# Patient Record
Sex: Female | Born: 1955
Health system: Southern US, Community
[De-identification: ages and names within clinical notes are randomized; demographics above are authoritative.]

## PROBLEM LIST (undated history)

## (undated) DIAGNOSIS — Z87442 Personal history of urinary calculi: Secondary | ICD-10-CM

## (undated) DIAGNOSIS — G500 Trigeminal neuralgia: Secondary | ICD-10-CM

## (undated) DIAGNOSIS — I10 Essential (primary) hypertension: Secondary | ICD-10-CM

## (undated) DIAGNOSIS — I471 Supraventricular tachycardia, unspecified: Secondary | ICD-10-CM

## (undated) DIAGNOSIS — R519 Headache, unspecified: Secondary | ICD-10-CM

## (undated) DIAGNOSIS — E785 Hyperlipidemia, unspecified: Secondary | ICD-10-CM

## (undated) DIAGNOSIS — J320 Chronic maxillary sinusitis: Secondary | ICD-10-CM

## (undated) HISTORY — DX: Supraventricular tachycardia, unspecified: I47.10

## (undated) HISTORY — DX: Hyperlipidemia, unspecified: E78.5

## (undated) HISTORY — DX: Trigeminal neuralgia: G50.0

## (undated) HISTORY — PX: PARS PLANA REPAIR OF RETINAL DEATACHMENT: SHX2165

## (undated) HISTORY — DX: Supraventricular tachycardia: I47.1

## (undated) HISTORY — DX: Headache, unspecified: R51.9

## (undated) HISTORY — DX: Essential (primary) hypertension: I10

## (undated) HISTORY — DX: Personal history of urinary calculi: Z87.442

## (undated) HISTORY — PX: OTHER SURGICAL HISTORY: SHX169

## (undated) HISTORY — PX: RECTOCELE REPAIR: SHX761

## (undated) HISTORY — DX: Chronic maxillary sinusitis: J32.0

---

## 1998-03-14 ENCOUNTER — Ambulatory Visit (HOSPITAL_COMMUNITY): Admission: RE | Admit: 1998-03-14 | Discharge: 1998-03-14 | Payer: Self-pay | Admitting: Urology

## 1998-03-14 ENCOUNTER — Encounter: Payer: Self-pay | Admitting: Urology

## 1999-01-01 ENCOUNTER — Encounter: Payer: Self-pay | Admitting: Obstetrics and Gynecology

## 1999-01-01 ENCOUNTER — Encounter: Admission: RE | Admit: 1999-01-01 | Discharge: 1999-01-01 | Payer: Self-pay | Admitting: Obstetrics and Gynecology

## 2000-01-02 ENCOUNTER — Encounter: Payer: Self-pay | Admitting: Obstetrics and Gynecology

## 2000-01-02 ENCOUNTER — Encounter: Admission: RE | Admit: 2000-01-02 | Discharge: 2000-01-02 | Payer: Self-pay | Admitting: Obstetrics and Gynecology

## 2001-01-07 ENCOUNTER — Encounter: Admission: RE | Admit: 2001-01-07 | Discharge: 2001-01-07 | Payer: Self-pay | Admitting: Obstetrics and Gynecology

## 2001-01-07 ENCOUNTER — Encounter: Payer: Self-pay | Admitting: Obstetrics and Gynecology

## 2001-01-18 ENCOUNTER — Encounter: Admission: RE | Admit: 2001-01-18 | Discharge: 2001-01-18 | Payer: Self-pay | Admitting: Obstetrics and Gynecology

## 2001-01-18 ENCOUNTER — Encounter: Payer: Self-pay | Admitting: Obstetrics and Gynecology

## 2002-02-06 ENCOUNTER — Encounter: Payer: Self-pay | Admitting: Obstetrics and Gynecology

## 2002-02-06 ENCOUNTER — Encounter: Admission: RE | Admit: 2002-02-06 | Discharge: 2002-02-06 | Payer: Self-pay | Admitting: Obstetrics and Gynecology

## 2002-02-09 ENCOUNTER — Encounter (INDEPENDENT_AMBULATORY_CARE_PROVIDER_SITE_OTHER): Payer: Self-pay | Admitting: *Deleted

## 2002-02-09 ENCOUNTER — Encounter: Admission: RE | Admit: 2002-02-09 | Discharge: 2002-02-09 | Payer: Self-pay | Admitting: Obstetrics and Gynecology

## 2002-02-09 ENCOUNTER — Encounter: Payer: Self-pay | Admitting: Obstetrics and Gynecology

## 2002-05-15 ENCOUNTER — Other Ambulatory Visit: Admission: RE | Admit: 2002-05-15 | Discharge: 2002-05-15 | Payer: Self-pay | Admitting: Obstetrics and Gynecology

## 2002-05-17 ENCOUNTER — Encounter: Payer: Self-pay | Admitting: Obstetrics and Gynecology

## 2002-05-17 ENCOUNTER — Ambulatory Visit (HOSPITAL_COMMUNITY): Admission: RE | Admit: 2002-05-17 | Discharge: 2002-05-17 | Payer: Self-pay | Admitting: Obstetrics and Gynecology

## 2002-06-07 ENCOUNTER — Inpatient Hospital Stay (HOSPITAL_COMMUNITY): Admission: RE | Admit: 2002-06-07 | Discharge: 2002-06-09 | Payer: Self-pay | Admitting: Obstetrics and Gynecology

## 2002-06-07 ENCOUNTER — Encounter (INDEPENDENT_AMBULATORY_CARE_PROVIDER_SITE_OTHER): Payer: Self-pay | Admitting: Specialist

## 2003-04-17 ENCOUNTER — Encounter: Admission: RE | Admit: 2003-04-17 | Discharge: 2003-04-17 | Payer: Self-pay | Admitting: Obstetrics and Gynecology

## 2004-05-01 ENCOUNTER — Encounter: Admission: RE | Admit: 2004-05-01 | Discharge: 2004-05-01 | Payer: Self-pay | Admitting: Obstetrics and Gynecology

## 2005-05-04 ENCOUNTER — Encounter: Admission: RE | Admit: 2005-05-04 | Discharge: 2005-05-04 | Payer: Self-pay | Admitting: Obstetrics and Gynecology

## 2006-05-12 ENCOUNTER — Encounter: Admission: RE | Admit: 2006-05-12 | Discharge: 2006-05-12 | Payer: Self-pay | Admitting: Obstetrics and Gynecology

## 2007-05-17 ENCOUNTER — Encounter: Admission: RE | Admit: 2007-05-17 | Discharge: 2007-05-17 | Payer: Self-pay | Admitting: Obstetrics and Gynecology

## 2008-05-21 ENCOUNTER — Encounter: Admission: RE | Admit: 2008-05-21 | Discharge: 2008-05-21 | Payer: Self-pay | Admitting: Obstetrics and Gynecology

## 2009-05-28 ENCOUNTER — Encounter: Admission: RE | Admit: 2009-05-28 | Discharge: 2009-05-28 | Payer: Self-pay | Admitting: Obstetrics and Gynecology

## 2010-05-19 ENCOUNTER — Other Ambulatory Visit: Payer: Self-pay | Admitting: Obstetrics and Gynecology

## 2010-05-19 DIAGNOSIS — Z1231 Encounter for screening mammogram for malignant neoplasm of breast: Secondary | ICD-10-CM

## 2010-05-23 NOTE — H&P (Signed)
NAME:  Heather Jenkins, Heather Jenkins                           ACCOUNT NO.:  [id number]   MEDICAL RECORD NO.:  [id number]                   PATIENT TYPE:  INP   LOCATION:  NA                                   FACILITY:  [facility]   PHYSICIAN:  Huel Cote, M.D.              DATE OF BIRTH:  25-Sep-1955   DATE OF ADMISSION:  06/07/2002  DATE OF DISCHARGE:                                HISTORY & PHYSICAL   Time of surgery:  June 07, 2002, at 12:30 p.m. at [facility] facility.   HISTORY OF PRESENT ILLNESS:  The patient is a 55 year old G2, P2, who is  admitted for an abdominal hysterectomy and resection of left ovarian mass  with a concurrent anterior posterior repair.  The patient has had an ongoing  problem with rectocele and uterine prolapse symptoms; however, she was  electing conservative management until recently when found to have a 6 x 6  cm left ovarian lesion with some nodularity and a thickened wall suspicious  for endometrioma.  When the cyst was discovered on exam, the patient had no  symptoms; however, given its complex nature on ultrasound and its enlarged  size, the patient was advised to have surgical removal to rule out neoplasm  and prevent future torsion events.  With the surgery planned, decision was  made to proceed with her hysterectomy and repair of pelvic prolapse at the  same time.   PAST MEDICAL HISTORY:  Significant only for kidney stones treated by Loraine Leriche C.  Vernie Ammons, M.D.   PAST SURGICAL HISTORY:  None.   PAST OBSTETRICAL HISTORY:  Normal spontaneous vaginal delivery x 2.   PAST GYNECOLOGIC HISTORY:  No abnormal Pap smears.  Menstrual cycles are  regular.  She does have a rectocele which has been present for many years  and has become increasingly uncomfortable.   FAMILY HISTORY:  No breast cancer, colon cancer, or heart disease.   MEDICATIONS:  None.   ALLERGIES:  PENICILLIN.   SOCIAL HISTORY:  She is a nonsmoker.   REVIEW OF SYSTEMS:  The patient currently  denies any symptoms of urinary  frequency or stress urinary incontinence. She does have mild urgency.  Cholesterol performed in January 2003 was borderline.  A mammogram performed  in February 2004 showed a fibroadenoma which was subsequently biopsied and  confirmed benign.   ALLERGIES:  PENICILLIN.   MEDICATIONS:  She is taking none currently.   PHYSICAL EXAMINATION:  VITAL SIGNS:  Blood pressure 130/90, weight 159  pounds.  BREASTS:  No masses, discharge or adenopathy noted.  There is a left  fibroadenoma noted at 1 o'clock, approximately 1.5. cm, mobile.  CARDIOVASCULAR:  Regular rate and rhythm.  LUNGS:  Clear.  ABDOMEN:  Soft and nontender.  PELVIC:  Normal external genitalia noted with a large grade 3 rectocele  present down to the introitus.  The uterus does have moderate prolapse to  approximately  2 cm inside the introitus, and the adnexa on the left is  enlarged with the mass noted on ultrasound.  RECTAL:  Negative for heme and confirms pelvic exam findings.   ASSESSMENT:  The patient was counseled as to the risks and benefits of  surgery including bleeding, infection, possible damage to bowel and bladder.  Options of therapy were discussed with the patient including laparoscopic-  assisted vaginal hysterectomy; however, given the large nature of the lesion  on her left ovary, 6 x 6 cm, and the possible complexity in nature, decision  was made to proceed with abdominal approach to prevent any spill with the  removal of the mass and to enable good pelvic support after the uterus is  removed.  Frozen section will be sent during the surgery to confirm there is  no additional malignant potential to the left ovarian mass.  If benign, the  patient wishes to retain her normal ovaries for hormonal function.  The  patient understands the uterus, cervix, and left ovary in entirety will be  removed, and the right ovary only in the event of significant pathology.  We  also discussed  anterior posterior repair in detail to correct her rectocele,  and the patient understands all procedures and desires to proceed.                                               Huel Cote, M.D.    KR/MEDQ  D:  05/31/2002  T:  05/31/2002  Job:  [id number]

## 2010-05-23 NOTE — Discharge Summary (Signed)
   NAME:  Heather Jenkins, Heather Jenkins                           ACCOUNT NO.:  [id number]   MEDICAL RECORD NO.:  [id number]                   PATIENT TYPE:  INP   LOCATION:  0448                                 FACILITY:  [facility]   PHYSICIAN:  Huel Cote, M.D.              DATE OF BIRTH:  1955/02/12   DATE OF ADMISSION:  06/07/2002  DATE OF DISCHARGE:  06/09/2002                                 DISCHARGE SUMMARY   DISCHARGE DIAGNOSES:  1. Left ovarian mass.  2. Uterine prolapse.  3. Rectocele.  4. Status post total abdominal hysterectomy, resection of left ovarian mass,     and a posterior repair.   DISCHARGE MEDICATIONS:  1. Motrin 600 mg p.o. q.6 h.  2. Percocet 1-2 tablets p.o. every 4 hours p.r.n.   DISCHARGE FOLLOW UP:  The patient is to follow up in the office on Monday,  June 10, 2002 for staple removal.   HOSPITAL COURSE:  The patient is a 55 year old G 2, P 2 who was admitted for  an abdominal hysterectomy and a resection of a left ovarian mass with a  concurrent anterior and posterior repair planned.  The patient underwent the  procedure and was found to have a 6 cm endometrioma which was adherent to  the pelvic sidewalls, the colon, and the pelvic floor.  This was resected at  the time of surgery and the patient underwent a total abdominal hysterectomy  and resection of the left ovary and tube and the mass itself as well as a  posterior repair as there did not appear to be any anterior vaginal wall  prolapse.  She was then admitted for routine postoperative care and did very  well.  On postop day #2, was tolerating a regular diet with no nausea or  vomiting, remained afebrile throughout the day, and her abdomen was benign.  Her incision was approximated, therefore she was discharged to home with  followup planned in the office the following week for staple removal.                                               Huel Cote, M.D.    KR/MEDQ  D:  07/06/2002  T:  07/06/2002   Job:  [id number]

## 2010-05-23 NOTE — Op Note (Signed)
NAME:  Heather Jenkins, Heather Jenkins                           ACCOUNT NO.:  [id number]   MEDICAL RECORD NO.:  [id number]                   PATIENT TYPE:  INP   LOCATION:  X010                                 FACILITY:  [facility]   PHYSICIAN:  Huel Cote, M.D.              DATE OF BIRTH:  [birth year]   DATE OF PROCEDURE:  06/07/2002  DATE OF DISCHARGE:                                 OPERATIVE REPORT   PREOPERATIVE DIAGNOSIS:  1. Left ovarian mass.  2. Uterine prolapse.  3. Rectocele.   POSTOPERATIVE DIAGNOSIS:  1. Left ovarian mass.  2. Uterine prolapse.  3. Rectocele.  4. Large left endometrioma by frozen section at time of surgery.   PROCEDURE:  Total abdominal hysterectomy, resection of a left ovarian mass  and left salpingo-oophorectomy, posterior repair.   SURGEON:  Huel Cote, M.D.   ASSISTANT:  Malachi Pro. Ambrose Mantle, M.D.   ANESTHESIA:  General endotracheal.   ESTIMATED BLOOD LOSS:  500 cc.   IV FLUIDS:  3,500 cc lactated Ringer's.   URINE OUTPUT:  400 cc.   FINDINGS:  There was a large left endometrioma noted which was adherent to  the pelvic sidewall and colon as well as to the pelvic floor.  This was  ruptured upon removal with copious amounts of chocolate colored fluid  suctioned out of the abdomen.  The uterus itself was normal.  The right  ovary and fallopian tube were normal.  There was an additional focus of  endometriosis in the right cul-de-sac which was fulgurated at the time of  procedure and the ureters appeared normal bilaterally.   DESCRIPTION OF PROCEDURE:  The patient was taken to the operating room where  general anesthesia was obtained without difficulty.  She was then prepped  and draped in usual sterile fashion in the dorsal lithotomy position.  Attention was then turned to the patient's abdomen where a Pfannenstiel  incision was then made with the scalpel approximately 2.0 cm above the  symphysis pubis.  This was carried through to underlying layer  of fascia  with both sharp dissection and Bovie electrocautery.  The fascia was then  nicked in the midline and the incision was extended laterally with Mayo  scissors.  The inferior aspect was grasped with Kocher clamps, elevated and  dissected off the underlying rectus muscles.  The superior aspect was  likewise elevated and dissected off the rectus muscles.  The rectus muscles  were separated in the midline and the peritoneum entered bluntly.  The  peritoneal incision was then extended both superiorly and inferiorly with  careful attention to avoid both bowel or bladder.  Bladder washings were  then obtained and held to the side awaiting final Pathology.  The pediatric  Balfour retractor was then placed within the incision and the bowel packed  away with moist laparotomy sponges.  There was an area of colon which was  adherent to the  endometrioma on the left and the left pelvic sidewall.  A  plane was opened with the Bovie electrocautery and this was successfully  removed from that area.  The left round ligament was then held with the  Guernsey forceps and transected with Bovie electrocautery.  The remaining  retroperitoneal space was opened with Bovie electrocautery thus the  infundibulopelvic ligament on the left was clearly isolated.  After some  dissection the ureter was clearly identified in a much lower position along  the left pelvic sidewall with the ureter clearly visualized and out of the  way.  The infundibulopelvic ligament was clamped with slightly curved  Zeppelin clamps, transected and suture ligated times two with 0 Vicryl.  Additional bites of peritoneum were taken down both with Bovie  electrocautery and suture ligatures of 0 Vicryl.  This was taken down to the  level of the sidewall and the endometrium was then slightly more mobile  although still adherent posteriorly to bowel.  The uterus was grasped with  two slightly curved Kelly clamps at each cornua and the  utero-ovarian  ligament on the patient's left was doubly clamped, transected and suture  ligated with 0Vicryl.  At this point there was some chocolate thick fluid  noted to be leaking from the aspect of the endometrioma which was adherent  to the utero-ovarian ligament and it was partially collapsed but then  readily peeled away from the remaining of the pelvic floor and bowel by  blunt dissection.  With it completely isolated, one additional Heaney clamp  was placed across the remaining adherent broad ligament, transected and  suture ligated.  The endometrioma was then free and sent to Pathology for  confirmation though clinically appeared benign.  The abdomen and pelvis were  irrigated of all endometrioma-type fluid.  No active bleeding was noted.  Attention was then turned to the patient's right where the round ligament  was transected with Bovie electrocautery and the retroperitoneal space  opened.  The utero-ovarian ligament on the right was then isolated, doubly  clamped with slightly curved Zeppelin clamps, transected and suture ligated  with 0Vicryl times two.  The remainder of the cardinal and broad ligament  were then taken down bilaterally in sequential bites with slightly curved  Zeppelin clamp down to the level of the external os.  Two right angle  Zeppelin's were then placed at each cuff angle and the cervix was amputated  from the vaginal cuff with Mayo scissors.  Care was made to retain as much  as vaginal tissue as possible trimming the cervix just where it met with the  vagina itself.  The vaginal cuff was then closed at each angle with 0Vicryl  and three additional sutures of 0Vicryl in figure-of-eight sutures were  placed at the midline for good hemostasis.  The pelvis and abdomen were once  again irrigated copiously with no active bleeding noted.  The bowel was  inspected and found to be normal except for some adherent brown discoloration from the endometrioma which was  adherent to the colon.  The  utero-ovarian pedicle on the right and the infundibulopelvic ligament on the  left were both hemostatic and the cuff appeared hemostatic.  The uterosacral  ligaments were then reapproximated with 0 silk and the vagina secured to  these for increased pelvic support.  With no active bleeding noted,  instruments were removed from the abdomen as well as laparotomy sponges  which had a correct count at this point.  The omentum was  replaced in its  normal anatomical position.  The fascia was then closed with 0Vicryl in a  running locked fashion.  The subcutaneous tissue was reapproximated with 3-0  Vicryl and skin was closed with staples.  At this point again all instrument  and sponge counts were correct and the patient was repositioned with her  legs up in Cut and Shoot stirrups and attention was turned to the vagina.  With the  hysterectomy and uterosacral suspension performed, there was no obvious  cystocele and the anterior vaginal wall appeared to have good support,  therefore, no anterior repair was felt necessary.  The posterior wall  however continued to demonstrate a rather large rectocele.  Therefore a the  hymenal ring the two Allis clamps were placed and the small amount of  transverse perineal tissue was excised with the Metzenbaum scissors.  The  Metzenbaum scissors were then utilized to underscore the vaginal mucosa  overlying the rectum along the midline and this was carried up to  approximately within 2 to 3 cm of the vaginal cuff.  The Metzenbaum scissors  were then used to trim away the fascial plane from the overlying vaginal  mucosa and the rectocele was bluntly and sharp dissected to the midline.  At  this point several interrupted sutures of 0Vicryl were used to repair the  rectocele.  There was a transverse defect primarily which was closed with  several interrupted sutures of 0Vicryl in the fascial plane.  At the  conclusion of the closure rectal  examination was performed and there were no  stitches palpable in the rectum and there did appear to be good support in  the rectovaginal septum.  The remaining vaginal mucosa overlying the rectum  was then closed with 2Vicryl in a running locked fashion and good hemostasis  was noted although there was some slight oozing, therefore, the vagina was  packed with vaginal packing of iodoform gauze with Estrace cream.  The  vagina easily  admitted two side-by-side digits at the conclusion of  the procedure and  appeared to be approximately 4 to 5 inches in length.  Again, sponge, lap,  and needle counts were correct times two and the patient was taken to the  recovery room extubated and in stable condition.                                                 Huel Cote, M.D.    KR/MEDQ  D:  06/07/2002  T:  06/07/2002  Job:  [id number]

## 2010-05-30 ENCOUNTER — Ambulatory Visit
Admission: RE | Admit: 2010-05-30 | Discharge: 2010-05-30 | Disposition: A | Discharge status: Home / Self Care | Payer: BC Managed Care – PPO | Source: Ambulatory Visit | Attending: Obstetrics and Gynecology | Admitting: Obstetrics and Gynecology

## 2010-05-30 ENCOUNTER — Inpatient Hospital Stay: Admission: RE | Admit: 2010-05-30 | Payer: Self-pay | Source: Ambulatory Visit

## 2010-05-30 DIAGNOSIS — Z1231 Encounter for screening mammogram for malignant neoplasm of breast: Secondary | ICD-10-CM

## 2011-05-26 ENCOUNTER — Other Ambulatory Visit: Payer: Self-pay | Admitting: Obstetrics and Gynecology

## 2011-05-26 DIAGNOSIS — Z1231 Encounter for screening mammogram for malignant neoplasm of breast: Secondary | ICD-10-CM

## 2011-05-26 DIAGNOSIS — Z780 Asymptomatic menopausal state: Secondary | ICD-10-CM

## 2011-06-03 ENCOUNTER — Ambulatory Visit
Admission: RE | Admit: 2011-06-03 | Discharge: 2011-06-03 | Disposition: A | Discharge status: Home / Self Care | Payer: BC Managed Care – PPO | Source: Ambulatory Visit | Attending: Obstetrics and Gynecology | Admitting: Obstetrics and Gynecology

## 2011-06-03 DIAGNOSIS — Z1231 Encounter for screening mammogram for malignant neoplasm of breast: Secondary | ICD-10-CM

## 2011-06-15 ENCOUNTER — Ambulatory Visit
Admission: RE | Admit: 2011-06-15 | Discharge: 2011-06-15 | Disposition: A | Discharge status: Home / Self Care | Payer: BC Managed Care – PPO | Source: Ambulatory Visit | Attending: Obstetrics and Gynecology | Admitting: Obstetrics and Gynecology

## 2011-06-15 DIAGNOSIS — Z780 Asymptomatic menopausal state: Secondary | ICD-10-CM

## 2011-07-24 ENCOUNTER — Telehealth (INDEPENDENT_AMBULATORY_CARE_PROVIDER_SITE_OTHER): Payer: Self-pay | Admitting: General Surgery

## 2011-07-24 NOTE — Telephone Encounter (Signed)
Called patient cell/home #, left message for call to be returned regarding the appointment set for 07/27/11.  Patient needs to advise if she was self referred, Seen by another physician - if so, we need notes/tests, Did she have surgery - appointment set up as post op/recheck and there is nothing in epic stating this patient has been seen here. Appointment may have been made in error, thus the call to confirm with the patient.

## 2011-07-24 NOTE — Telephone Encounter (Signed)
Patient called returning Dana's phone call, reconfirming that her appointment had be cancelled.  She was scheduled by mistake, she hasn't any kind of hernia sx, has never been in our office and does not know who Dr. Cleone Slim is.  Transferred call to Luane School for further questioning on this issue.

## 2011-07-27 ENCOUNTER — Encounter (INDEPENDENT_AMBULATORY_CARE_PROVIDER_SITE_OTHER): Payer: Self-pay | Admitting: General Surgery

## 2012-05-23 ENCOUNTER — Other Ambulatory Visit: Payer: Self-pay

## 2012-05-23 DIAGNOSIS — Z1231 Encounter for screening mammogram for malignant neoplasm of breast: Secondary | ICD-10-CM

## 2012-06-23 ENCOUNTER — Ambulatory Visit
Admission: RE | Admit: 2012-06-23 | Discharge: 2012-06-23 | Disposition: A | Discharge status: Home / Self Care | Payer: BC Managed Care – PPO | Source: Ambulatory Visit

## 2012-06-23 DIAGNOSIS — Z1231 Encounter for screening mammogram for malignant neoplasm of breast: Secondary | ICD-10-CM

## 2013-06-12 ENCOUNTER — Other Ambulatory Visit: Payer: Self-pay

## 2013-06-12 DIAGNOSIS — Z1231 Encounter for screening mammogram for malignant neoplasm of breast: Secondary | ICD-10-CM

## 2013-06-27 ENCOUNTER — Encounter (INDEPENDENT_AMBULATORY_CARE_PROVIDER_SITE_OTHER): Payer: Self-pay

## 2013-06-27 ENCOUNTER — Ambulatory Visit
Admission: RE | Admit: 2013-06-27 | Discharge: 2013-06-27 | Disposition: A | Discharge status: Home / Self Care | Payer: BC Managed Care – PPO | Source: Ambulatory Visit

## 2013-06-27 DIAGNOSIS — Z1231 Encounter for screening mammogram for malignant neoplasm of breast: Secondary | ICD-10-CM

## 2014-06-08 ENCOUNTER — Other Ambulatory Visit: Payer: Self-pay

## 2014-06-08 DIAGNOSIS — Z1231 Encounter for screening mammogram for malignant neoplasm of breast: Secondary | ICD-10-CM

## 2014-06-29 ENCOUNTER — Ambulatory Visit
Admission: RE | Admit: 2014-06-29 | Discharge: 2014-06-29 | Disposition: A | Discharge status: Home / Self Care | Payer: BLUE CROSS/BLUE SHIELD | Source: Ambulatory Visit

## 2014-06-29 DIAGNOSIS — Z1231 Encounter for screening mammogram for malignant neoplasm of breast: Secondary | ICD-10-CM

## 2015-05-07 DIAGNOSIS — H40052 Ocular hypertension, left eye: Secondary | ICD-10-CM | POA: Diagnosis not present

## 2015-05-07 DIAGNOSIS — H40051 Ocular hypertension, right eye: Secondary | ICD-10-CM | POA: Diagnosis not present

## 2015-05-07 DIAGNOSIS — H43813 Vitreous degeneration, bilateral: Secondary | ICD-10-CM | POA: Diagnosis not present

## 2015-05-07 DIAGNOSIS — H524 Presbyopia: Secondary | ICD-10-CM | POA: Diagnosis not present

## 2015-05-09 DIAGNOSIS — Z01419 Encounter for gynecological examination (general) (routine) without abnormal findings: Secondary | ICD-10-CM | POA: Diagnosis not present

## 2015-05-09 DIAGNOSIS — Z130 Encounter for screening for diseases of the blood and blood-forming organs and certain disorders involving the immune mechanism: Secondary | ICD-10-CM | POA: Diagnosis not present

## 2015-05-09 DIAGNOSIS — Z1389 Encounter for screening for other disorder: Secondary | ICD-10-CM | POA: Diagnosis not present

## 2015-05-09 DIAGNOSIS — Z6833 Body mass index (BMI) 33.0-33.9, adult: Secondary | ICD-10-CM | POA: Diagnosis not present

## 2015-05-27 DIAGNOSIS — Z0000 Encounter for general adult medical examination without abnormal findings: Secondary | ICD-10-CM | POA: Diagnosis not present

## 2015-05-27 DIAGNOSIS — I119 Hypertensive heart disease without heart failure: Secondary | ICD-10-CM | POA: Diagnosis not present

## 2015-06-13 ENCOUNTER — Other Ambulatory Visit: Payer: Self-pay | Admitting: Obstetrics and Gynecology

## 2015-06-13 DIAGNOSIS — Z1231 Encounter for screening mammogram for malignant neoplasm of breast: Secondary | ICD-10-CM

## 2015-07-03 ENCOUNTER — Ambulatory Visit
Admission: RE | Admit: 2015-07-03 | Discharge: 2015-07-03 | Disposition: A | Discharge status: Home / Self Care | Payer: BLUE CROSS/BLUE SHIELD | Source: Ambulatory Visit | Attending: Obstetrics and Gynecology | Admitting: Obstetrics and Gynecology

## 2015-07-03 DIAGNOSIS — Z1231 Encounter for screening mammogram for malignant neoplasm of breast: Secondary | ICD-10-CM | POA: Diagnosis not present

## 2015-07-11 ENCOUNTER — Ambulatory Visit: Payer: BLUE CROSS/BLUE SHIELD

## 2015-10-14 DIAGNOSIS — J209 Acute bronchitis, unspecified: Secondary | ICD-10-CM | POA: Diagnosis not present

## 2015-10-14 DIAGNOSIS — J019 Acute sinusitis, unspecified: Secondary | ICD-10-CM | POA: Diagnosis not present

## 2015-10-23 DIAGNOSIS — J209 Acute bronchitis, unspecified: Secondary | ICD-10-CM | POA: Diagnosis not present

## 2015-10-23 DIAGNOSIS — I119 Hypertensive heart disease without heart failure: Secondary | ICD-10-CM | POA: Diagnosis not present

## 2015-11-25 DIAGNOSIS — I119 Hypertensive heart disease without heart failure: Secondary | ICD-10-CM | POA: Diagnosis not present

## 2015-11-25 DIAGNOSIS — D559 Anemia due to enzyme disorder, unspecified: Secondary | ICD-10-CM | POA: Diagnosis not present

## 2015-11-25 DIAGNOSIS — E039 Hypothyroidism, unspecified: Secondary | ICD-10-CM | POA: Diagnosis not present

## 2015-11-25 DIAGNOSIS — Z23 Encounter for immunization: Secondary | ICD-10-CM | POA: Diagnosis not present

## 2015-11-25 DIAGNOSIS — E7800 Pure hypercholesterolemia, unspecified: Secondary | ICD-10-CM | POA: Diagnosis not present

## 2016-02-25 DIAGNOSIS — I10 Essential (primary) hypertension: Secondary | ICD-10-CM | POA: Diagnosis not present

## 2016-05-20 DIAGNOSIS — H524 Presbyopia: Secondary | ICD-10-CM | POA: Diagnosis not present

## 2016-05-20 DIAGNOSIS — H40053 Ocular hypertension, bilateral: Secondary | ICD-10-CM | POA: Diagnosis not present

## 2016-05-20 DIAGNOSIS — H5213 Myopia, bilateral: Secondary | ICD-10-CM | POA: Diagnosis not present

## 2016-05-28 DIAGNOSIS — Z0000 Encounter for general adult medical examination without abnormal findings: Secondary | ICD-10-CM | POA: Diagnosis not present

## 2016-05-28 DIAGNOSIS — I10 Essential (primary) hypertension: Secondary | ICD-10-CM | POA: Diagnosis not present

## 2016-05-28 DIAGNOSIS — Z6841 Body Mass Index (BMI) 40.0 and over, adult: Secondary | ICD-10-CM | POA: Diagnosis not present

## 2016-05-28 DIAGNOSIS — Z1211 Encounter for screening for malignant neoplasm of colon: Secondary | ICD-10-CM | POA: Diagnosis not present

## 2016-06-05 DIAGNOSIS — Z01419 Encounter for gynecological examination (general) (routine) without abnormal findings: Secondary | ICD-10-CM | POA: Diagnosis not present

## 2016-06-05 DIAGNOSIS — Z1389 Encounter for screening for other disorder: Secondary | ICD-10-CM | POA: Diagnosis not present

## 2016-06-05 DIAGNOSIS — Z6832 Body mass index (BMI) 32.0-32.9, adult: Secondary | ICD-10-CM | POA: Diagnosis not present

## 2016-06-12 ENCOUNTER — Other Ambulatory Visit: Payer: Self-pay | Admitting: Obstetrics and Gynecology

## 2016-06-12 DIAGNOSIS — Z1231 Encounter for screening mammogram for malignant neoplasm of breast: Secondary | ICD-10-CM

## 2016-07-03 ENCOUNTER — Ambulatory Visit
Admission: RE | Admit: 2016-07-03 | Discharge: 2016-07-03 | Disposition: A | Discharge status: Home / Self Care | Payer: BLUE CROSS/BLUE SHIELD | Source: Ambulatory Visit | Attending: Obstetrics and Gynecology | Admitting: Obstetrics and Gynecology

## 2016-07-03 DIAGNOSIS — Z1231 Encounter for screening mammogram for malignant neoplasm of breast: Secondary | ICD-10-CM

## 2016-11-03 DIAGNOSIS — Z23 Encounter for immunization: Secondary | ICD-10-CM | POA: Diagnosis not present

## 2016-12-04 ENCOUNTER — Ambulatory Visit
Admission: RE | Admit: 2016-12-04 | Discharge: 2016-12-04 | Disposition: A | Discharge status: Home / Self Care | Payer: BLUE CROSS/BLUE SHIELD | Source: Ambulatory Visit | Attending: Internal Medicine | Admitting: Internal Medicine

## 2016-12-04 ENCOUNTER — Other Ambulatory Visit: Payer: Self-pay | Admitting: Internal Medicine

## 2016-12-04 DIAGNOSIS — I10 Essential (primary) hypertension: Secondary | ICD-10-CM | POA: Diagnosis not present

## 2016-12-04 DIAGNOSIS — T1490XA Injury, unspecified, initial encounter: Secondary | ICD-10-CM

## 2016-12-04 DIAGNOSIS — N200 Calculus of kidney: Secondary | ICD-10-CM | POA: Diagnosis not present

## 2016-12-04 DIAGNOSIS — R609 Edema, unspecified: Secondary | ICD-10-CM

## 2016-12-04 DIAGNOSIS — R319 Hematuria, unspecified: Secondary | ICD-10-CM | POA: Diagnosis not present

## 2016-12-04 DIAGNOSIS — M7989 Other specified soft tissue disorders: Secondary | ICD-10-CM | POA: Diagnosis not present

## 2017-01-18 DIAGNOSIS — J029 Acute pharyngitis, unspecified: Secondary | ICD-10-CM | POA: Diagnosis not present

## 2017-06-01 DIAGNOSIS — H524 Presbyopia: Secondary | ICD-10-CM | POA: Diagnosis not present

## 2017-06-01 DIAGNOSIS — H40053 Ocular hypertension, bilateral: Secondary | ICD-10-CM | POA: Diagnosis not present

## 2017-06-01 DIAGNOSIS — H43813 Vitreous degeneration, bilateral: Secondary | ICD-10-CM | POA: Diagnosis not present

## 2017-06-01 DIAGNOSIS — H2513 Age-related nuclear cataract, bilateral: Secondary | ICD-10-CM | POA: Diagnosis not present

## 2017-06-02 DIAGNOSIS — I10 Essential (primary) hypertension: Secondary | ICD-10-CM | POA: Diagnosis not present

## 2017-06-02 DIAGNOSIS — D559 Anemia due to enzyme disorder, unspecified: Secondary | ICD-10-CM | POA: Diagnosis not present

## 2017-06-02 DIAGNOSIS — N200 Calculus of kidney: Secondary | ICD-10-CM | POA: Diagnosis not present

## 2017-06-02 DIAGNOSIS — E785 Hyperlipidemia, unspecified: Secondary | ICD-10-CM | POA: Diagnosis not present

## 2017-06-08 DIAGNOSIS — Z01419 Encounter for gynecological examination (general) (routine) without abnormal findings: Secondary | ICD-10-CM | POA: Diagnosis not present

## 2017-06-08 DIAGNOSIS — Z1389 Encounter for screening for other disorder: Secondary | ICD-10-CM | POA: Diagnosis not present

## 2017-06-08 DIAGNOSIS — Z6832 Body mass index (BMI) 32.0-32.9, adult: Secondary | ICD-10-CM | POA: Diagnosis not present

## 2017-06-21 ENCOUNTER — Other Ambulatory Visit: Payer: Self-pay | Admitting: Obstetrics and Gynecology

## 2017-06-21 DIAGNOSIS — Z1231 Encounter for screening mammogram for malignant neoplasm of breast: Secondary | ICD-10-CM

## 2017-06-23 DIAGNOSIS — R319 Hematuria, unspecified: Secondary | ICD-10-CM | POA: Diagnosis not present

## 2017-07-06 DIAGNOSIS — N202 Calculus of kidney with calculus of ureter: Secondary | ICD-10-CM | POA: Diagnosis not present

## 2017-07-06 DIAGNOSIS — R310 Gross hematuria: Secondary | ICD-10-CM | POA: Diagnosis not present

## 2017-07-06 DIAGNOSIS — N200 Calculus of kidney: Secondary | ICD-10-CM | POA: Diagnosis not present

## 2017-07-09 ENCOUNTER — Ambulatory Visit
Admission: RE | Admit: 2017-07-09 | Discharge: 2017-07-09 | Disposition: A | Discharge status: Home / Self Care | Payer: BLUE CROSS/BLUE SHIELD | Source: Ambulatory Visit | Attending: Obstetrics and Gynecology | Admitting: Obstetrics and Gynecology

## 2017-07-09 DIAGNOSIS — Z1231 Encounter for screening mammogram for malignant neoplasm of breast: Secondary | ICD-10-CM | POA: Diagnosis not present

## 2017-07-13 DIAGNOSIS — R310 Gross hematuria: Secondary | ICD-10-CM | POA: Diagnosis not present

## 2017-07-13 DIAGNOSIS — N200 Calculus of kidney: Secondary | ICD-10-CM | POA: Diagnosis not present

## 2017-07-15 DIAGNOSIS — N202 Calculus of kidney with calculus of ureter: Secondary | ICD-10-CM | POA: Diagnosis not present

## 2017-08-24 DIAGNOSIS — N200 Calculus of kidney: Secondary | ICD-10-CM | POA: Diagnosis not present

## 2017-12-23 DIAGNOSIS — N209 Urinary calculus, unspecified: Secondary | ICD-10-CM | POA: Diagnosis not present

## 2017-12-23 DIAGNOSIS — I10 Essential (primary) hypertension: Secondary | ICD-10-CM | POA: Diagnosis not present

## 2018-02-14 DIAGNOSIS — J019 Acute sinusitis, unspecified: Secondary | ICD-10-CM | POA: Diagnosis not present

## 2018-02-24 ENCOUNTER — Other Ambulatory Visit: Payer: Self-pay | Admitting: Internal Medicine

## 2018-02-24 ENCOUNTER — Ambulatory Visit
Admission: RE | Admit: 2018-02-24 | Discharge: 2018-02-24 | Disposition: A | Discharge status: Home / Self Care | Payer: BLUE CROSS/BLUE SHIELD | Source: Ambulatory Visit | Attending: Internal Medicine | Admitting: Internal Medicine

## 2018-02-24 DIAGNOSIS — J329 Chronic sinusitis, unspecified: Secondary | ICD-10-CM

## 2018-02-24 DIAGNOSIS — J3489 Other specified disorders of nose and nasal sinuses: Secondary | ICD-10-CM | POA: Diagnosis not present

## 2018-02-28 DIAGNOSIS — J019 Acute sinusitis, unspecified: Secondary | ICD-10-CM | POA: Diagnosis not present

## 2018-02-28 DIAGNOSIS — I10 Essential (primary) hypertension: Secondary | ICD-10-CM | POA: Diagnosis not present

## 2018-03-10 DIAGNOSIS — I10 Essential (primary) hypertension: Secondary | ICD-10-CM | POA: Diagnosis not present

## 2018-03-16 DIAGNOSIS — J320 Chronic maxillary sinusitis: Secondary | ICD-10-CM | POA: Diagnosis not present

## 2018-03-16 DIAGNOSIS — J322 Chronic ethmoidal sinusitis: Secondary | ICD-10-CM | POA: Diagnosis not present

## 2018-03-16 DIAGNOSIS — J342 Deviated nasal septum: Secondary | ICD-10-CM | POA: Diagnosis not present

## 2018-03-25 DIAGNOSIS — J342 Deviated nasal septum: Secondary | ICD-10-CM | POA: Diagnosis not present

## 2018-03-25 DIAGNOSIS — J3081 Allergic rhinitis due to animal (cat) (dog) hair and dander: Secondary | ICD-10-CM | POA: Diagnosis not present

## 2018-03-25 DIAGNOSIS — J301 Allergic rhinitis due to pollen: Secondary | ICD-10-CM | POA: Diagnosis not present

## 2018-06-06 DIAGNOSIS — J019 Acute sinusitis, unspecified: Secondary | ICD-10-CM | POA: Diagnosis not present

## 2018-06-06 DIAGNOSIS — K635 Polyp of colon: Secondary | ICD-10-CM | POA: Diagnosis not present

## 2018-06-06 DIAGNOSIS — Z008 Encounter for other general examination: Secondary | ICD-10-CM | POA: Diagnosis not present

## 2018-06-06 DIAGNOSIS — I10 Essential (primary) hypertension: Secondary | ICD-10-CM | POA: Diagnosis not present

## 2018-06-14 DIAGNOSIS — H04123 Dry eye syndrome of bilateral lacrimal glands: Secondary | ICD-10-CM | POA: Diagnosis not present

## 2018-06-14 DIAGNOSIS — H2513 Age-related nuclear cataract, bilateral: Secondary | ICD-10-CM | POA: Diagnosis not present

## 2018-06-14 DIAGNOSIS — H25013 Cortical age-related cataract, bilateral: Secondary | ICD-10-CM | POA: Diagnosis not present

## 2018-06-14 DIAGNOSIS — H524 Presbyopia: Secondary | ICD-10-CM | POA: Diagnosis not present

## 2018-07-05 DIAGNOSIS — Z124 Encounter for screening for malignant neoplasm of cervix: Secondary | ICD-10-CM | POA: Diagnosis not present

## 2018-07-05 DIAGNOSIS — Z01419 Encounter for gynecological examination (general) (routine) without abnormal findings: Secondary | ICD-10-CM | POA: Diagnosis not present

## 2018-07-05 DIAGNOSIS — Z130 Encounter for screening for diseases of the blood and blood-forming organs and certain disorders involving the immune mechanism: Secondary | ICD-10-CM | POA: Diagnosis not present

## 2018-07-05 DIAGNOSIS — Z6832 Body mass index (BMI) 32.0-32.9, adult: Secondary | ICD-10-CM | POA: Diagnosis not present

## 2018-07-06 ENCOUNTER — Other Ambulatory Visit: Payer: Self-pay | Admitting: Obstetrics and Gynecology

## 2018-07-06 DIAGNOSIS — Z1231 Encounter for screening mammogram for malignant neoplasm of breast: Secondary | ICD-10-CM

## 2018-07-07 ENCOUNTER — Other Ambulatory Visit: Payer: Self-pay | Admitting: Obstetrics and Gynecology

## 2018-07-07 DIAGNOSIS — E2839 Other primary ovarian failure: Secondary | ICD-10-CM

## 2018-07-11 ENCOUNTER — Other Ambulatory Visit: Payer: Self-pay

## 2018-07-11 ENCOUNTER — Ambulatory Visit
Admission: RE | Admit: 2018-07-11 | Discharge: 2018-07-11 | Disposition: A | Discharge status: Home / Self Care | Payer: BC Managed Care – PPO | Source: Ambulatory Visit | Attending: Obstetrics and Gynecology | Admitting: Obstetrics and Gynecology

## 2018-07-11 DIAGNOSIS — Z1231 Encounter for screening mammogram for malignant neoplasm of breast: Secondary | ICD-10-CM

## 2018-08-22 DIAGNOSIS — K635 Polyp of colon: Secondary | ICD-10-CM | POA: Diagnosis not present

## 2018-08-22 DIAGNOSIS — K5730 Diverticulosis of large intestine without perforation or abscess without bleeding: Secondary | ICD-10-CM | POA: Diagnosis not present

## 2018-08-22 DIAGNOSIS — Z1211 Encounter for screening for malignant neoplasm of colon: Secondary | ICD-10-CM | POA: Diagnosis not present

## 2018-08-29 DIAGNOSIS — N200 Calculus of kidney: Secondary | ICD-10-CM | POA: Diagnosis not present

## 2018-08-31 ENCOUNTER — Other Ambulatory Visit: Payer: BC Managed Care – PPO

## 2018-09-15 ENCOUNTER — Ambulatory Visit
Admission: RE | Admit: 2018-09-15 | Discharge: 2018-09-15 | Disposition: A | Discharge status: Home / Self Care | Payer: BC Managed Care – PPO | Source: Ambulatory Visit | Attending: Obstetrics and Gynecology | Admitting: Obstetrics and Gynecology

## 2018-09-15 ENCOUNTER — Other Ambulatory Visit: Payer: Self-pay

## 2018-09-15 DIAGNOSIS — E2839 Other primary ovarian failure: Secondary | ICD-10-CM

## 2018-09-15 DIAGNOSIS — Z780 Asymptomatic menopausal state: Secondary | ICD-10-CM | POA: Diagnosis not present

## 2018-09-15 DIAGNOSIS — M85851 Other specified disorders of bone density and structure, right thigh: Secondary | ICD-10-CM | POA: Diagnosis not present

## 2018-10-06 DIAGNOSIS — J019 Acute sinusitis, unspecified: Secondary | ICD-10-CM | POA: Diagnosis not present

## 2018-10-06 DIAGNOSIS — I10 Essential (primary) hypertension: Secondary | ICD-10-CM | POA: Diagnosis not present

## 2018-11-28 DIAGNOSIS — H43811 Vitreous degeneration, right eye: Secondary | ICD-10-CM | POA: Diagnosis not present

## 2018-12-12 DIAGNOSIS — H43811 Vitreous degeneration, right eye: Secondary | ICD-10-CM | POA: Diagnosis not present

## 2018-12-13 DIAGNOSIS — E7800 Pure hypercholesterolemia, unspecified: Secondary | ICD-10-CM | POA: Diagnosis not present

## 2018-12-13 DIAGNOSIS — R799 Abnormal finding of blood chemistry, unspecified: Secondary | ICD-10-CM | POA: Diagnosis not present

## 2018-12-13 DIAGNOSIS — I10 Essential (primary) hypertension: Secondary | ICD-10-CM | POA: Diagnosis not present

## 2019-01-03 DIAGNOSIS — I10 Essential (primary) hypertension: Secondary | ICD-10-CM | POA: Diagnosis not present

## 2019-01-27 DIAGNOSIS — E7849 Other hyperlipidemia: Secondary | ICD-10-CM | POA: Diagnosis not present

## 2019-01-27 DIAGNOSIS — R739 Hyperglycemia, unspecified: Secondary | ICD-10-CM | POA: Diagnosis not present

## 2019-01-27 DIAGNOSIS — I10 Essential (primary) hypertension: Secondary | ICD-10-CM | POA: Diagnosis not present

## 2019-01-27 DIAGNOSIS — Z1331 Encounter for screening for depression: Secondary | ICD-10-CM | POA: Diagnosis not present

## 2019-04-12 DIAGNOSIS — G500 Trigeminal neuralgia: Secondary | ICD-10-CM | POA: Diagnosis not present

## 2019-04-12 DIAGNOSIS — J0100 Acute maxillary sinusitis, unspecified: Secondary | ICD-10-CM | POA: Diagnosis not present

## 2019-04-20 DIAGNOSIS — J320 Chronic maxillary sinusitis: Secondary | ICD-10-CM | POA: Diagnosis not present

## 2019-04-20 DIAGNOSIS — G501 Atypical facial pain: Secondary | ICD-10-CM | POA: Diagnosis not present

## 2019-04-21 ENCOUNTER — Other Ambulatory Visit: Payer: Self-pay | Admitting: Internal Medicine

## 2019-04-21 DIAGNOSIS — J320 Chronic maxillary sinusitis: Secondary | ICD-10-CM

## 2019-04-21 DIAGNOSIS — G501 Atypical facial pain: Secondary | ICD-10-CM

## 2019-04-25 DIAGNOSIS — J342 Deviated nasal septum: Secondary | ICD-10-CM | POA: Diagnosis not present

## 2019-04-25 DIAGNOSIS — J3489 Other specified disorders of nose and nasal sinuses: Secondary | ICD-10-CM | POA: Diagnosis not present

## 2019-04-25 DIAGNOSIS — J320 Chronic maxillary sinusitis: Secondary | ICD-10-CM | POA: Diagnosis not present

## 2019-04-27 ENCOUNTER — Other Ambulatory Visit: Payer: Self-pay

## 2019-04-27 ENCOUNTER — Emergency Department (HOSPITAL_COMMUNITY)
Admission: EM | Admit: 2019-04-27 | Discharge: 2019-04-27 | Disposition: A | Discharge status: Home / Self Care | Payer: BC Managed Care – PPO | Attending: Emergency Medicine | Admitting: Emergency Medicine

## 2019-04-27 ENCOUNTER — Encounter (HOSPITAL_COMMUNITY): Payer: Self-pay | Admitting: Emergency Medicine

## 2019-04-27 DIAGNOSIS — R519 Headache, unspecified: Secondary | ICD-10-CM

## 2019-04-27 MED ORDER — HYDROCODONE-ACETAMINOPHEN 5-325 MG PO TABS
1.0000 | ORAL_TABLET | Freq: Once | ORAL | Status: AC
  Administered 2019-04-27: 1 via ORAL
  Filled 2019-04-27: qty 1

## 2019-04-27 NOTE — ED Provider Notes (Signed)
[facility] Note   CSN: [id number] Arrival date & time: 04/27/19  1455     History Chief Complaint  Patient presents with  . Trigeminal Neuralgia  . Facial Pain    Heather Jenkins is a 64 y.o. female who presents to the ED with episodes of left-sided facial pain described as "lightning" has been occurring for approximately 1 year.  She states that her symptoms abated for a period of time, but came back in February of this year.  Since then, she has been experiencing increased frequency, and during episodes that last 4-5 minutes several times per hour.  The symptoms affect her entire left-sided maxillary region with extension to her forehead and scalp.  She was seen by her primary care provider at [facility] who referred her to ENT because she also endorses a chronic history of sinusitis.  I reviewed patient's CT maxillofacial with contrast obtained 04/25/2019 which demonstrated some maxillary mucosal thickening, worse on the left side in the right side, however no other significant findings.  While patient endorses sinus congestion and the left side ear "fullness", this sharp, fleeting left-sided facial pain she suspects to be neurologic.  She has an appointment with [facility] scheduled for 05/02/2019.  She has been taking gabapentin, which is only mildly alleviate her symptoms of discomfort.  HPI     History reviewed. No pertinent past medical history.  There are no problems to display for this patient.   History reviewed. No pertinent surgical history.   OB History   No obstetric history on file.     No family history on file.  Social History   Tobacco Use  . Smoking status: Not on file  Substance Use Topics  . Alcohol use: Not on file  . Drug use: Not on file    Home Medications Prior to Admission medications   Not on File    Allergies    Penicillins  Review of Systems   Review of Systems  All  other systems reviewed and are negative.   Physical Exam Updated Vital Signs BP (!) 169/88 (BP Location: Left Arm)   Pulse 98   Temp 98.2 F (36.8 C) (Oral)   Resp 18   SpO2 97%   Physical Exam Vitals and nursing note reviewed. Exam conducted with a chaperone present.  Constitutional:      Appearance: Normal appearance.  HENT:     Head: Normocephalic and atraumatic.  Eyes:     General: No scleral icterus.    Extraocular Movements: Extraocular movements intact.     Conjunctiva/sclera: Conjunctivae normal.     Pupils: Pupils are equal, round, and reactive to light.  Cardiovascular:     Rate and Rhythm: Normal rate and regular rhythm.     Pulses: Normal pulses.     Heart sounds: Normal heart sounds.  Pulmonary:     Effort: Pulmonary effort is normal.  Musculoskeletal:     Cervical back: Normal range of motion and neck supple. No rigidity.  Skin:    General: Skin is dry.     Capillary Refill: Capillary refill takes less than 2 seconds.  Neurological:     Mental Status: She is alert.     GCS: GCS eye subscore is 4. GCS verbal subscore is 5. GCS motor subscore is 6.     Comments: CN II through XII grossly intact.  Smile symmetrically.  ROM and sensation intact throughout.  Ambulates without ataxia.  PERRL and EOM intact.  Left-sided facial sensation intact.  No pain elicited on my examination.  Psychiatric:        Mood and Affect: Mood normal.        Behavior: Behavior normal.        Thought Content: Thought content normal.      ED Results / Procedures / Treatments   Labs (all labs ordered are listed, but only abnormal results are displayed) Labs Reviewed - No data to display  EKG None  Radiology No results found.  Procedures Procedures (including critical care time)  Medications Ordered in ED Medications  HYDROcodone-acetaminophen (NORCO/VICODIN) 5-325 MG per tablet 1 tablet (has no administration in time range)    ED Course  I have reviewed the triage  vital signs and the nursing notes.  Pertinent labs & imaging results that were available during my care of the patient were reviewed by me and considered in my medical decision making (see chart for details).    MDM Rules/Calculators/A&P                      Patient is already taking gabapentin and recently had her dose increased.  Shortly after she was roomed here in the ED, she received a phone call from her primary care provider who states that they will be willing to prescribe her a short course of tramadol to help for breakthrough symptoms.  I feel as though that is appropriate.  I agree with her assessment and plan.  I personally reviewed patient's CT maxillofacial that was obtained 04/25/2019 do not feel as though any additional imaging is warranted at this time.  Do not feel as though laboratory work-up would yield any significant findings.  Her history of physical exam is suggestive of a trigeminal neuralgia and I feel as though neurology intervention is once ultimately going to lead to definitive management.  Encouraging patient to go to her appointment, as scheduled.  Do not want to alter current management at this time.  Strict ED return precautions discussed.  All of the evaluation and work-up results were discussed with the patient and any family at bedside. They were provided opportunity to ask any additional questions and have none at this time. They have expressed understanding of verbal discharge instructions as well as return precautions and are agreeable to the plan.   Final Clinical Impression(s) / ED Diagnoses Final diagnoses:  Facial pain    Rx / DC Orders ED Discharge Orders    None       Corena Herter, PA-C 04/27/19 Berton Mount, MD 04/28/19 1511

## 2019-04-27 NOTE — Discharge Instructions (Addendum)
Please read the attachment on trigeminal neuralgia.  Follow-up with your primary care provider regarding today's encounter.  You need take your medications, as prescribed.    Please go to your appointment with [facility] on 05/02/2019, as scheduled.  Please return to the ED or seek immediate medical attention should you experience any new or worsening symptoms.

## 2019-04-27 NOTE — ED Triage Notes (Signed)
Pt had issues with facial pain for year. Has trigeminal neuralgia and waiting to get into neurologist next week. Reports that pain is so bad couldn't wait to get into see them next week.  Started on Gabapentin but hasnt helped. Tried ice and heat, topical meds and no relief as well as tylenol and Ibuprofen.

## 2019-05-02 ENCOUNTER — Other Ambulatory Visit: Payer: BC Managed Care – PPO

## 2019-05-02 ENCOUNTER — Ambulatory Visit: Payer: BC Managed Care – PPO | Admitting: Neurology

## 2019-05-02 ENCOUNTER — Other Ambulatory Visit: Payer: Self-pay

## 2019-05-02 ENCOUNTER — Encounter: Payer: Self-pay | Admitting: Neurology

## 2019-05-02 VITALS — BP 144/74 | HR 93 | Temp 96.9°F | Ht 64.0 in | Wt 196.0 lb

## 2019-05-02 DIAGNOSIS — G500 Trigeminal neuralgia: Secondary | ICD-10-CM | POA: Insufficient documentation

## 2019-05-02 DIAGNOSIS — R7989 Other specified abnormal findings of blood chemistry: Secondary | ICD-10-CM | POA: Diagnosis not present

## 2019-05-02 DIAGNOSIS — R799 Abnormal finding of blood chemistry, unspecified: Secondary | ICD-10-CM | POA: Diagnosis not present

## 2019-05-02 MED ORDER — OXCARBAZEPINE 300 MG PO TABS: 300.0000 mg | ORAL_TABLET | Freq: Two times a day (BID) | ORAL | 11 refills | Status: DC

## 2019-05-02 MED ORDER — GABAPENTIN 300 MG PO CAPS: 600.0000 mg | ORAL_CAPSULE | Freq: Three times a day (TID) | ORAL | 11 refills | Status: DC

## 2019-05-02 NOTE — Progress Notes (Signed)
PATIENT: Heather Jenkins DOB: 08-05-1955  Chief Complaint  Patient presents with  . Possible Trigeminal Neuralgia    She is here, with her husband Heather Jenkins, for have her left-sided facial pain evaluated. Radiates up her nose and towards her eye. Symptoms started in September 2019. She has tried steroids in the past. She is currently taking gabapentin 300mg , two capsules TID and Tramadol 50mg , two tablets TID PRN.  . PCP    Dr.      HISTORICAL  Heather Jenkins is a 64 year old female, seen in request by her primary care physician Dr. Lajuana Jenkins, 69, for evaluation of intermittent left facial pain, she is accompanied by her husband at today's clinical visit on May 02, 2019.  I have reviewed and summarized the referring note from the referring physician.  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  She reported intermittent left facial pain, shooting from left cheek to left nose since September 2019, initial episode was short lasting, intermittent, she was treated as sinus infection  She had recurrent episode again in June 2020, lasting for 3 months, no diagnosis was made  She had most severe episode since February 2021, still ongoing, she reported the pain is present three-quarter of the time, sharp severe radiating pain from left cheek to left inner eye corner, worsening by teeth brushing, chewing, talking, touching her face,  CT sinus from [facility] on April 25, 2019, amount of mucosal thickening at the left greater than right inferior maxillary sinus, minimum LOCATION of the right ethmoid air cells,  Was treated with titrating dose of gabapentin which has helped her symptoms some, now taking 300 mg 2 tablets 3 times a day, complains of drowsiness, despite that, she has been taking tramadol 50 mg 2 tablets 3 times a day, the combination helped her pain better, but she complains of increased  side effect  She denies rash broke out, denies visual loss, hearing loss,  REVIEW OF SYSTEMS: Full 14 system review of systems performed and notable only for as above All other review of systems were negative.  ALLERGIES: Allergies  Allergen Reactions  . Penicillins Hives    HOME MEDICATIONS: Current Outpatient Medications  Medication Sig Dispense Refill  . atorvastatin (LIPITOR) 10 MG tablet Take 10 mg by mouth daily.    . Cholecalciferol (VITAMIN D3 PO) Take 1,000 Units by mouth daily.    . fexofenadine-pseudoephedrine (ALLEGRA-D 24) 180-240 MG 24 hr tablet Take 1 tablet by mouth daily as needed.    . gabapentin (NEURONTIN) 300 MG capsule Take 600 mg by mouth 3 (three) times daily.    . hydrochlorothiazide (HYDRODIURIL) 25 MG tablet Take 25 mg by mouth daily.    [place] losartan (COZAAR) 100 MG tablet Take 100 mg by mouth daily.    . traMADol (ULTRAM) 50 MG tablet Take 100 mg by mouth every 8 (eight) hours as needed.     No current facility-administered medications for this visit.    PAST MEDICAL HISTORY: Past Medical History:  Diagnosis Date  . Chronic maxillary sinusitis   . History of kidney stones   . Hyperlipemia   . Hypertension   . Left facial pain     PAST SURGICAL HISTORY: Past Surgical History:  Procedure Laterality Date  . RECTOCELE REPAIR    . TAH w/ single oophorectomy      FAMILY HISTORY: Family History  Problem Relation Age of Onset  .  Ovarian cancer Mother   . Suicidality Father        age 70  . Melanoma Sister     SOCIAL HISTORY: Social History   Socioeconomic History  . Marital status: Married    Spouse name: Not on file  . Number of children: 2  . Years of education: college  . Highest education level: Associate degree: academic program  Occupational History  . Occupation: Astronomer  Tobacco Use  . Smoking status: Never Smoker  . Smokeless tobacco: Never Used  Substance and Sexual Activity  . Alcohol use: Yes    Comment: rare    . Drug use: Never  . Sexual activity: Not on file  Other Topics Concern  . Not on file  Social History Narrative   Lives at home with husband.   Right-handed.   Caffeine: occasional use.   Social Determinants of Health   Financial Resource Strain:   . Difficulty of Paying Living Expenses:   Food Insecurity:   . Worried About Programme researcher, broadcasting/film/video in the Last Year:   . Barista in the Last Year:   Transportation Needs:   . Freight forwarder (Medical):   Marland Kitchen Lack of Transportation (Non-Medical):   Physical Activity:   . Days of Exercise per Week:   . Minutes of Exercise per Session:   Stress:   . Feeling of Stress :   Social Connections:   . Frequency of Communication with Friends and Family:   . Frequency of Social Gatherings with Friends and Family:   . Attends Religious Services:   . Active Member of Clubs or Organizations:   . Attends Banker Meetings:   Marland Kitchen Marital Status:   Intimate Partner Violence:   . Fear of Current or Ex-Partner:   . Emotionally Abused:   Marland Kitchen Physically Abused:   . Sexually Abused:      PHYSICAL EXAM   Vitals:   05/02/19 1440  BP: (!) 144/74  Pulse: 93  Temp: (!) 96.9 F (36.1 C)  Weight: 196 lb (88.9 kg)  Height: 5\' 4"  (1.626 m)    Not recorded      Body mass index is 33.64 kg/m.  PHYSICAL EXAMNIATION:  Gen: NAD, conversant, well nourised, well groomed                     Cardiovascular: Regular rate rhythm, no peripheral edema, warm, nontender. Eyes: Conjunctivae clear without exudates or hemorrhage Neck: Supple, no carotid bruits. Pulmonary: Clear to auscultation bilaterally   NEUROLOGICAL EXAM:  MENTAL STATUS: Speech:    Speech is normal; fluent and spontaneous with normal comprehension.  Cognition:     Orientation to time, place and person     Normal recent and remote memory     Normal Attention span and concentration     Normal Language, naming, repeating,spontaneous speech     Fund of  knowledge   CRANIAL NERVES: CN II: Visual fields are full to confrontation. Pupils are round equal and briskly reactive to light. CN III, IV, VI: extraocular movement are normal. No ptosis. CN V: Facial sensation is intact to light touch, bilateral corneal reflex was symmetric. CN VII: Face is symmetric with normal eye closure  CN VIII: Hearing is normal to causal conversation. CN IX, X: Phonation is normal. CN XI: Head turning and shoulder shrug are intact  MOTOR: There is no pronator drift of out-stretched arms. Muscle bulk and tone are normal. Muscle strength is  normal.  REFLEXES: Reflexes are 2+ and symmetric at the biceps, triceps, 3/3 knees, and ankles. Plantar responses are flexor.  SENSORY: Intact to light touch, pinprick and vibratory sensation are intact in fingers and toes.  COORDINATION: There is no trunk or limb dysmetria noted.  GAIT/STANCE: Posture is normal. Gait is steady with normal steps, base, arm swing, and turning. Heel and toe walking are normal. Tandem gait is normal.  Romberg is absent.   DIAGNOSTIC DATA (LABS, IMAGING, TESTING) - I reviewed patient records, labs, notes, testing and imaging myself where available.   ASSESSMENT AND PLAN  Semaya Vida Gunkel is a 64 y.o. female   Left trigeminal neuralgia  Laboratory evaluations  MRI of the brain with without contrast  She continue to have frequent pain taking gabapentin 600 mg 3 times a day, tramadol 50 mg 2 tablets 3 times a day  We will add on Trileptal titrating to 150 mg 2 tablets twice a day, if her facial pain improved, will taper down tramadol first, and gabapentin later  Return to clinic with nurse practitioner in 4 to 6 weeks  Marcial Pacas, M.D. Ph.D.  [facility] [address], [place], [place] [address] Ph: [phone] Fax: [phone]  CC: Michael Boston, MD

## 2019-05-03 ENCOUNTER — Telehealth: Payer: Self-pay | Admitting: Neurology

## 2019-05-03 LAB — CBC WITH DIFFERENTIAL
Basophils Absolute: 0 10*3/uL (ref 0.0–0.2)
Basos: 1 %
EOS (ABSOLUTE): 0.2 10*3/uL (ref 0.0–0.4)
Eos: 2 %
Hematocrit: 39.7 % (ref 34.0–46.6)
Hemoglobin: 13.3 g/dL (ref 11.1–15.9)
Immature Grans (Abs): 0 10*3/uL (ref 0.0–0.1)
Immature Granulocytes: 0 %
Lymphocytes Absolute: 2.1 10*3/uL (ref 0.7–3.1)
Lymphs: 31 %
MCH: 29.6 pg (ref 26.6–33.0)
MCHC: 33.5 g/dL (ref 31.5–35.7)
MCV: 88 fL (ref 79–97)
Monocytes Absolute: 0.8 10*3/uL (ref 0.1–0.9)
Monocytes: 11 %
Neutrophils Absolute: 3.8 10*3/uL (ref 1.4–7.0)
Neutrophils: 55 %
RBC: 4.49 x10E6/uL (ref 3.77–5.28)
RDW: 12.9 % (ref 11.7–15.4)
WBC: 6.9 10*3/uL (ref 3.4–10.8)

## 2019-05-03 LAB — COMPREHENSIVE METABOLIC PANEL
ALT: 34 IU/L — ABNORMAL HIGH (ref 0–32)
AST: 20 IU/L (ref 0–40)
Albumin/Globulin Ratio: 2 (ref 1.2–2.2)
Albumin: 4.5 g/dL (ref 3.8–4.8)
Alkaline Phosphatase: 64 IU/L (ref 39–117)
BUN/Creatinine Ratio: 15 (ref 12–28)
BUN: 11 mg/dL (ref 8–27)
Bilirubin Total: 0.6 mg/dL (ref 0.0–1.2)
CO2: 29 mmol/L (ref 20–29)
Calcium: 10 mg/dL (ref 8.7–10.3)
Chloride: 99 mmol/L (ref 96–106)
Creatinine, Ser: 0.73 mg/dL (ref 0.57–1.00)
GFR calc Af Amer: 102 mL/min/{1.73_m2} (ref >59)
GFR calc non Af Amer: 89 mL/min/{1.73_m2} (ref >59)
Globulin, Total: 2.3 g/dL (ref 1.5–4.5)
Glucose: 134 mg/dL — ABNORMAL HIGH (ref 65–99)
Potassium: 3.7 mmol/L (ref 3.5–5.2)
Sodium: 141 mmol/L (ref 134–144)
Total Protein: 6.8 g/dL (ref 6.0–8.5)

## 2019-05-03 LAB — TSH: TSH: 1.33 u[IU]/mL (ref 0.450–4.500)

## 2019-05-03 LAB — SEDIMENTATION RATE: Sed Rate: 4 mm/hr (ref 0–40)

## 2019-05-03 LAB — C-REACTIVE PROTEIN: CRP: 12 mg/L — ABNORMAL HIGH (ref 0–10)

## 2019-05-03 NOTE — Telephone Encounter (Signed)
Please call patient laboratory evaluations showed no significant abnormality, slight elevated C-reactive protein in the setting of normal ESR has unknown clinical significance.  Mild elevated glucose is likely due to the timing of the blood sample and her last meal.

## 2019-05-03 NOTE — Telephone Encounter (Signed)
I spoke to the patient and she verbalized understanding of the results. 

## 2019-05-09 ENCOUNTER — Telehealth: Payer: Self-pay | Admitting: Neurology

## 2019-05-09 NOTE — Telephone Encounter (Signed)
no to the covid questions MR Brain w/wo contrast Dr. Mertie Clause Auth: [id number] (exp. 05/05/19 o 10/31/19. Patient is scheduled at [facility] for 05/10/19.

## 2019-05-10 ENCOUNTER — Other Ambulatory Visit: Payer: Self-pay

## 2019-05-10 ENCOUNTER — Ambulatory Visit: Payer: BC Managed Care – PPO

## 2019-05-10 DIAGNOSIS — G500 Trigeminal neuralgia: Secondary | ICD-10-CM | POA: Diagnosis not present

## 2019-05-10 MED ORDER — GADOBENATE DIMEGLUMINE 529 MG/ML IV SOLN
20.0000 mL | Freq: Once | INTRAVENOUS | Status: AC | PRN
  Administered 2019-05-10: 20 mL via INTRAVENOUS

## 2019-05-15 ENCOUNTER — Telehealth: Payer: Self-pay

## 2019-05-15 NOTE — Telephone Encounter (Signed)
I called pt. I advised her of the MRI results. Pt will follow up as scheduled in May. Pt verbalized understanding of results. Pt had no questions at this time but was encouraged to call back if questions arise.

## 2019-05-15 NOTE — Telephone Encounter (Signed)
-----   Message from Levert Feinstein, MD sent at 05/15/2019  2:44 PM EDT ----- Please call pt for no significant abnormality on MRI of the brain with without contrast.

## 2019-05-29 ENCOUNTER — Ambulatory Visit: Payer: BC Managed Care – PPO | Admitting: Neurology

## 2019-05-29 ENCOUNTER — Encounter: Payer: Self-pay | Admitting: Neurology

## 2019-05-29 ENCOUNTER — Other Ambulatory Visit: Payer: Self-pay

## 2019-05-29 VITALS — BP 141/85 | HR 85 | Ht 64.0 in | Wt 199.0 lb

## 2019-05-29 DIAGNOSIS — G500 Trigeminal neuralgia: Secondary | ICD-10-CM

## 2019-05-29 NOTE — Patient Instructions (Addendum)
Continue current medications See you back in 4 months or sooner if needed

## 2019-05-29 NOTE — Progress Notes (Signed)
PATIENT: Heather Jenkins DOB: 1955/04/16  REASON FOR VISIT: follow up HISTORY FROM: patient  HISTORY OF PRESENT ILLNESS: Today 05/29/19  HISTORY Heather Jenkins is a 64 year old female, seen in request by her primary care physician Dr. Jacalyn Lefevre, Jesse Sans, for evaluation of intermittent left facial pain, she is accompanied by her husband at today's clinical visit on May 02, 2019.  I have reviewed and summarized the referring note from the referring physician.  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  She reported intermittent left facial pain, shooting from left cheek to left nose since September 2019, initial episode was short lasting, intermittent, she was treated as sinus infection  She had recurrent episode again in June 2020, lasting for 3 months, no diagnosis was made  She had most severe episode since February 2021, still ongoing, she reported the pain is present three-quarter of the time, sharp severe radiating pain from left cheek to left inner eye corner, worsening by teeth brushing, chewing, talking, touching her face,  CT sinus from [facility] on April 25, 2019, amount of mucosal thickening at the left greater than right inferior maxillary sinus, minimum LOCATION of the right ethmoid air cells,  Was treated with titrating dose of gabapentin which has helped her symptoms some, now taking 300 mg 2 tablets 3 times a day, complains of drowsiness, despite that, she has been taking tramadol 50 mg 2 tablets 3 times a day, the combination helped her pain better, but she complains of increased side effect  She denies rash broke out, denies visual loss, hearing loss,  Update May 29, 2019 SS: Laboratory evaluation showed no significant abnormality, slight elevated CRP 12, normal ESR.  MRI of the brain in May 2021 showed no significant abnormality.  When last seen, Trileptal was added. Doing  really well currently, is off tramadol, taking oxcarbazepine 300 mg twice daily, gabapentin 600 mg 3 times daily. Her mother passed away recently. Has not had any pain recently.  REVIEW OF SYSTEMS: Out of a complete 14 system review of symptoms, the patient complains only of the following symptoms, and all other reviewed systems are negative.  Facial pain  ALLERGIES: Allergies  Allergen Reactions  . Penicillins Hives    HOME MEDICATIONS: Outpatient Medications Prior to Visit  Medication Sig Dispense Refill  . atorvastatin (LIPITOR) 10 MG tablet Take 10 mg by mouth daily.    . Cholecalciferol (VITAMIN D3 PO) Take 1,000 Units by mouth daily.    . fexofenadine-pseudoephedrine (ALLEGRA-D 24) 180-240 MG 24 hr tablet Take 1 tablet by mouth daily as needed.    . gabapentin (NEURONTIN) 300 MG capsule Take 2 capsules (600 mg total) by mouth 3 (three) times daily. 180 capsule 11  . hydrochlorothiazide (HYDRODIURIL) 25 MG tablet Take 25 mg by mouth daily.    Marland Kitchen losartan (COZAAR) 100 MG tablet Take 100 mg by mouth daily.    . Oxcarbazepine (TRILEPTAL) 300 MG tablet Take 1 tablet (300 mg total) by mouth 2 (two) times daily. 60 tablet 11  . traMADol (ULTRAM) 50 MG tablet Take 100 mg by mouth every 8 (eight) hours as needed.     No facility-administered medications prior to visit.    PAST MEDICAL HISTORY: Past Medical History:  Diagnosis Date  . Chronic maxillary sinusitis   . History of kidney stones   . Hyperlipemia   . Hypertension   . Left facial pain  PAST SURGICAL HISTORY: Past Surgical History:  Procedure Laterality Date  . RECTOCELE REPAIR    . TAH w/ single oophorectomy      FAMILY HISTORY: Family History  Problem Relation Age of Onset  . Ovarian cancer Mother   . Suicidality Father        age 64  . Melanoma Sister     SOCIAL HISTORY: Social History   Socioeconomic History  . Marital status: Married    Spouse name: Not on file  . Number of children: 2  . Years  of education: college  . Highest education level: Associate degree: academic program  Occupational History  . Occupation: Surveyor, quantity  Tobacco Use  . Smoking status: Never Smoker  . Smokeless tobacco: Never Used  Substance and Sexual Activity  . Alcohol use: Yes    Comment: rare  . Drug use: Never  . Sexual activity: Not on file  Other Topics Concern  . Not on file  Social History Narrative   Lives at home with husband.   Right-handed.   Caffeine: occasional use.   Social Determinants of Health   Financial Resource Strain:   . Difficulty of Paying Living Expenses:   Food Insecurity:   . Worried About Charity fundraiser in the Last Year:   . Arboriculturist in the Last Year:   Transportation Needs:   . Film/video editor (Medical):   Marland Kitchen Lack of Transportation (Non-Medical):   Physical Activity:   . Days of Exercise per Week:   . Minutes of Exercise per Session:   Stress:   . Feeling of Stress :   Social Connections:   . Frequency of Communication with Friends and Family:   . Frequency of Social Gatherings with Friends and Family:   . Attends Religious Services:   . Active Member of Clubs or Organizations:   . Attends Archivist Meetings:   Marland Kitchen Marital Status:   Intimate Partner Violence:   . Fear of Current or Ex-Partner:   . Emotionally Abused:   Marland Kitchen Physically Abused:   . Sexually Abused:    PHYSICAL EXAM  Vitals:   05/29/19 1513  BP: (!) 141/85  Pulse: 85  Weight: 199 lb (90.3 kg)  Height: _0  (1.626 m)   Body mass index is 34.16 kg/m.  Generalized: Well developed, in no acute distress   Neurological examination  Mentation: Alert oriented to time, place, history taking. Follows all commands speech and language fluent Cranial nerve II-XII: Pupils were equal round reactive to light. Extraocular movements were full, visual field were full on confrontational test. Facial sensation and strength were normal. Head turning and shoulder shrug   were normal and symmetric. Motor: The motor testing reveals 5 over 5 strength of all 4 extremities. Good symmetric motor tone is noted throughout.  Sensory: Sensory testing is intact to soft touch on all 4 extremities. No evidence of extinction is noted.  Coordination: Cerebellar testing reveals good finger-nose-finger and heel-to-shin bilaterally.  Gait and station: Gait is normal.  Reflexes: Deep tendon reflexes are symmetric and normal bilaterally.   DIAGNOSTIC DATA (LABS, IMAGING, TESTING) - I reviewed patient records, labs, notes, testing and imaging myself where available.  Lab Results  Component Value Date   WBC 6.9 05/02/2019   HGB 13.3 05/02/2019   HCT 39.7 05/02/2019   MCV 88 05/02/2019      Component Value Date/Time   NA 141 05/02/2019 1557   K 3.7 05/02/2019 1557  CL 99 05/02/2019 1557   CO2 29 05/02/2019 1557   GLUCOSE 134 (H) 05/02/2019 1557   BUN 11 05/02/2019 1557   CREATININE 0.73 05/02/2019 1557   CALCIUM 10.0 05/02/2019 1557   PROT 6.8 05/02/2019 1557   ALBUMIN 4.5 05/02/2019 1557   AST 20 05/02/2019 1557   ALT 34 (H) 05/02/2019 1557   ALKPHOS 64 05/02/2019 1557   BILITOT 0.6 05/02/2019 1557   GFRNONAA 89 05/02/2019 1557   GFRAA 102 05/02/2019 1557   No results found for: CHOL, HDL, LDLCALC, LDLDIRECT, TRIG, CHOLHDL No results found for: HGBA1C No results found for: VITAMINB12 Lab Results  Component Value Date   TSH 1.330 05/02/2019      ASSESSMENT AND PLAN 64 y.o. year old female  has a past medical history of Chronic maxillary sinusitis, History of kidney stones, Hyperlipemia, Hypertension, and Left facial pain. here with:  1.  Left trigeminal neuralgia -MRI of the brain with and without contrast in May 2021 did not show significant abnormality -TSH, ESR, CBC were normal, CRP mildly elevated 12, glucose 134 -Pain currently well controlled, has come off tramadol, tolerating meds well -Continue oxcarbazepine 300 mg twice a day, most recent  sodium 141 -Continue gabapentin 600 mg 3 times a day -We will not decrease dose of current medications, will keep as is, patient is fearful of another flare -We will follow-up in 4 months or sooner if needed  I spent 20 minutes of face-to-face and non-face-to-face time with patient.  This included previsit chart review, lab review, study review, order entry, electronic health record documentation, patient education.  Butler Denmark, AGNP-C, DNP 05/29/2019, 3:58 PM [facility] [address], [place], [place] [address] [phone]

## 2019-06-16 DIAGNOSIS — R739 Hyperglycemia, unspecified: Secondary | ICD-10-CM | POA: Diagnosis not present

## 2019-06-16 DIAGNOSIS — I10 Essential (primary) hypertension: Secondary | ICD-10-CM | POA: Diagnosis not present

## 2019-06-16 DIAGNOSIS — E7849 Other hyperlipidemia: Secondary | ICD-10-CM | POA: Diagnosis not present

## 2019-06-20 DIAGNOSIS — H25013 Cortical age-related cataract, bilateral: Secondary | ICD-10-CM | POA: Diagnosis not present

## 2019-06-20 DIAGNOSIS — H40053 Ocular hypertension, bilateral: Secondary | ICD-10-CM | POA: Diagnosis not present

## 2019-06-20 DIAGNOSIS — H524 Presbyopia: Secondary | ICD-10-CM | POA: Diagnosis not present

## 2019-06-20 DIAGNOSIS — H33301 Unspecified retinal break, right eye: Secondary | ICD-10-CM | POA: Diagnosis not present

## 2019-06-21 ENCOUNTER — Ambulatory Visit (INDEPENDENT_AMBULATORY_CARE_PROVIDER_SITE_OTHER): Payer: BC Managed Care – PPO | Admitting: Ophthalmology

## 2019-06-21 ENCOUNTER — Other Ambulatory Visit: Payer: Self-pay

## 2019-06-21 ENCOUNTER — Encounter (INDEPENDENT_AMBULATORY_CARE_PROVIDER_SITE_OTHER): Payer: Self-pay | Admitting: Ophthalmology

## 2019-06-21 ENCOUNTER — Other Ambulatory Visit: Payer: Self-pay | Admitting: Obstetrics and Gynecology

## 2019-06-21 DIAGNOSIS — H3581 Retinal edema: Secondary | ICD-10-CM

## 2019-06-21 DIAGNOSIS — H35033 Hypertensive retinopathy, bilateral: Secondary | ICD-10-CM

## 2019-06-21 DIAGNOSIS — H25813 Combined forms of age-related cataract, bilateral: Secondary | ICD-10-CM

## 2019-06-21 DIAGNOSIS — H43811 Vitreous degeneration, right eye: Secondary | ICD-10-CM

## 2019-06-21 DIAGNOSIS — H3321 Serous retinal detachment, right eye: Secondary | ICD-10-CM | POA: Diagnosis not present

## 2019-06-21 DIAGNOSIS — Z1231 Encounter for screening mammogram for malignant neoplasm of breast: Secondary | ICD-10-CM

## 2019-06-21 DIAGNOSIS — H33311 Horseshoe tear of retina without detachment, right eye: Secondary | ICD-10-CM

## 2019-06-21 DIAGNOSIS — I10 Essential (primary) hypertension: Secondary | ICD-10-CM

## 2019-06-21 MED ORDER — PREDNISOLONE ACETATE 1 % OP SUSP
1.0000 [drp] | Freq: Four times a day (QID) | OPHTHALMIC | 0 refills | Status: AC
Start: 2019-06-21 — End: 2019-06-28

## 2019-06-21 NOTE — Progress Notes (Signed)
[facility] Note  06/21/2019     CHIEF COMPLAINT Patient presents for Retina Evaluation   HISTORY OF PRESENT ILLNESS: Heather Jenkins is a 64 y.o. female who presents to the clinic today for:   HPI    Retina Evaluation    In right eye.  Onset: Unknown.  Duration of 1 day.  Associated Symptoms Flashes and Floaters.  Context:  distance vision, mid-range vision and near vision.  Treatments tried include artificial tears.  Response to treatment was no improvement.  I, the attending physician,  performed the HPI with the patient and updated documentation appropriately.          Comments    64 y/o female pt referred by Dr. Satira Sark yesterday, 6.15.21 for eval of PVD and ret tears OD.  Pt saw Dr. Satira Sark yesterday for her CEE.  Pt noticed intermittent flashes and floaters OD for the first time in November of 2020, and she has noticed them every so often ever since, especially when she looks to the left.  Denies pain.  VA good OU cc (better w/glasses than cl).  Systane prn OU.       Last edited by Bernarda Caffey, MD on 06/21/2019  1:53 PM. (History)    pt states she went to see Dr. Satira Sark for her routine eye exam and says that Dr. Satira Sark saw some tears in her right eye, pt states back in November she saw a large floater in that eye and saw Dr. Delman Cheadle, pt states he did not see any tears and told her the floater would resolve on its own, she did not follow up about the floaters and it eventually went away, pt states she sees fol occasionally, usually at night, pt states she takes medication for blood pressure  Referring physician: Marygrace Drought, MD [place],  [place] [phone]  HISTORICAL INFORMATION:   Selected notes from the MEDICAL RECORD NUMBER Referred by Dr. Satira Sark for retinal tear OD   CURRENT MEDICATIONS: Current Outpatient Medications (Ophthalmic Drugs)  Medication Sig  . prednisoLONE acetate (PRED FORTE) 1 % ophthalmic suspension Place  1 drop into the right eye 4 (four) times daily for 7 days.   No current facility-administered medications for this visit. (Ophthalmic Drugs)   Current Outpatient Medications (Other)  Medication Sig  . atorvastatin (LIPITOR) 10 MG tablet Take 10 mg by mouth daily.  . Cholecalciferol (VITAMIN D3 PO) Take 1,000 Units by mouth daily.  . fexofenadine-pseudoephedrine (ALLEGRA-D 24) 180-240 MG 24 hr tablet Take 1 tablet by mouth daily as needed.  . gabapentin (NEURONTIN) 300 MG capsule Take 2 capsules (600 mg total) by mouth 3 (three) times daily.  . hydrochlorothiazide (HYDRODIURIL) 25 MG tablet Take 25 mg by mouth daily.  Marland Kitchen losartan (COZAAR) 100 MG tablet Take 100 mg by mouth daily.  . Oxcarbazepine (TRILEPTAL) 300 MG tablet Take 1 tablet (300 mg total) by mouth 2 (two) times daily.   No current facility-administered medications for this visit. (Other)      REVIEW OF SYSTEMS: ROS    Positive for: Eyes   Negative for: Constitutional, Gastrointestinal, Neurological, Skin, Genitourinary, Musculoskeletal, HENT, Endocrine, Cardiovascular, Respiratory, Psychiatric, Allergic/Imm, Heme/Lymph   Last edited by Matthew Folks, COA on 06/21/2019  1:25 PM. (History)       ALLERGIES Allergies  Allergen Reactions  . Penicillins Hives    PAST MEDICAL HISTORY Past Medical History:  Diagnosis Date  . Chronic maxillary sinusitis   .  History of kidney stones   . Hyperlipemia   . Hypertension   . Left facial pain    Past Surgical History:  Procedure Laterality Date  . RECTOCELE REPAIR    . TAH w/ single oophorectomy      FAMILY HISTORY Family History  Problem Relation Age of Onset  . Ovarian cancer Mother   . Suicidality Father        age 80  . Melanoma Sister     SOCIAL HISTORY Social History   Tobacco Use  . Smoking status: Never Smoker  . Smokeless tobacco: Never Used  Substance Use Topics  . Alcohol use: Yes    Comment: rare  . Drug use: Never         OPHTHALMIC  EXAM:  Base Eye Exam    Visual Acuity (Snellen - Linear)      Right Left   Dist cc 20/40 20/25 -2   Dist ph cc 20/20 -2 20/20 -2   Correction: Contacts       Tonometry (Tonopen, 1:26 PM)      Right Left   Pressure 15 13       Pupils      Dark Light Shape React APD   Right 4 3 Round Brisk None   Left 4 3 Round Brisk None       Visual Fields (Counting fingers)      Left Right    Full Full       Extraocular Movement      Right Left    Full, Ortho Full, Ortho       Neuro/Psych    Oriented x3: Yes   Mood/Affect: Normal       Dilation    Both eyes: 1.0% Mydriacyl, 2.5% Phenylephrine @ 1:26 PM        Slit Lamp and Fundus Exam    Slit Lamp Exam      Right Left   Lids/Lashes Dermatochalasis - upper lid Dermatochalasis - upper lid   Conjunctiva/Sclera White and quiet White and quiet   Cornea Trace Punctate epithelial erosions Trace Punctate epithelial erosions   Anterior Chamber Deep and quiet Deep and quiet   Iris Round and dilated Round and dilated   Lens 1-2+ Nuclear sclerosis, 1-2+ Cortical cataract 1-2+ Nuclear sclerosis, 1-2+ Cortical cataract   Vitreous Vitreous syneresis, no pigment, Posterior vitreous detachment, Weiss ring Vitreous syneresis, no pigment       Fundus Exam      Right Left   Disc Pink and Sharp, temporal PPA Pink and Sharp, temporal PPA   C/D Ratio 0.4 0.4   Macula Flat, Blunted foveal reflex, mild RPE mottling and clumping, No heme or edema Flat, good foveal reflex, mild RPE mottling and clumping, No heme or edema   Vessels Vascular attenuation, mild tortuousity Vascular attenuation, mild tortuousity, mild AV crossing changes   Periphery Attached, pigmented paving stone degeneration inferiorly at 0600, HST at 1230 with small cuff of SRF Attached, mild pigmented paving stone degeneration inferiorly at 0600        Refraction    Wearing Rx      Sphere Cylinder Axis Add   Right -9.00 Sphere  +2.25   Left -7.25 +0.50 076 +2.25   Age: 25yr    Type: PAL  Glasses.  Pt wore cl when checking VA OU.       Manifest Refraction      Sphere Cylinder Axis Dist VA   Right -9.25 Sphere  20/20-2  Left -7.50 +0.75 080 20/20-2          IMAGING AND PROCEDURES  Imaging and Procedures for '@TODAY' @  OCT, Retina - OU - Both Eyes       Right Eye Quality was good. Central Foveal Thickness: 220. Progression has no prior data. Findings include normal foveal contour, no IRF, no SRF, myopic contour.   Left Eye Quality was good. Central Foveal Thickness: 228. Progression has no prior data. Findings include normal foveal contour, no IRF, no SRF, myopic contour, vitreomacular adhesion .   Notes *Images captured and stored on drive  Diagnosis / Impression:  NFP, no IRF/SRF OU Myopic contour OU  Clinical management:  See below  Abbreviations: NFP - Normal foveal profile. CME - cystoid macular edema. PED - pigment epithelial detachment. IRF - intraretinal fluid. SRF - subretinal fluid. EZ - ellipsoid zone. ERM - epiretinal membrane. ORA - outer retinal atrophy. ORT - outer retinal tubulation. SRHM - subretinal hyper-reflective material        Repair Retinal Detach, Photocoag - OD - Right Eye       LASER PROCEDURE NOTE  Procedure:  Barrier laser retinopexy using laser indirect ophthalmoscope, RIGHT eye   Diagnosis:   Retinal tear w/ cuff of SRF / focal RD, RIGHT eye                     Horseshoe tear at 1230 o'clock anterior to equator   Surgeon: Bernarda Caffey, MD, PhD  Anesthesia: Topical  Informed consent obtained, operative eye marked, and time out performed prior to initiation of laser.   Laser settings:  Lumenis [id number] laser indirect ophthalmoscope Power: 320 mW Duration: 100 msec  # spots: 750  Placement of laser: Laser was placed in three confluent rows around HST at 1230 oclock anterior to equator with additional rows anteriorly.  Complications: None.  Patient tolerated the procedure well and received  written and verbal post-procedure care information/education.                  ASSESSMENT/PLAN:    ICD-10-CM   1. Retinal tear of right eye  H33.311 Repair Retinal Detach, Photocoag - OD - Right Eye  2. Right retinal detachment  H33.21 Repair Retinal Detach, Photocoag - OD - Right Eye  3. Retinal edema  H35.81 OCT, Retina - OU - Both Eyes  4. Posterior vitreous detachment of right eye  H43.811   5. Essential hypertension  I10   6. Hypertensive retinopathy of both eyes  H35.033   7. Combined forms of age-related cataract of both eyes  H25.813    1-3. Horseshoe tear w/ cuff of SRF / focal RD, OD  - HST located at 1230 with cuff of SRF  - recommend laser retinopexy OD today, 06.16.21  - pt wishes to proceed  - RBA of procedure discussed, questions answered  - informed consent obtained and signed  - see procedure note  - start PF qid OD x7 days  - f/u 2 weeks, DFE, OCT  4. PVD / vitreous syneresis OD  - pt reports appearance of floaters back in November 2020 and was seen by Dr. Delman Cheadle  - Discussed findings and prognosis  - RT/focal RD identified OD as above  - Reviewed s/s of RT/RD  - Strict return precautions for any such RT/RD signs/symptoms  - monitor  5,6. Hypertensive retinopathy OU  - discussed importance of tight BP control  - monitor  7. Mixed form cataract  - under the  expert management of Dr. Satira Sark  - not yet visually significant    Ophthalmic Meds Ordered this visit:  Meds ordered this encounter  Medications  . prednisoLONE acetate (PRED FORTE) 1 % ophthalmic suspension    Sig: Place 1 drop into the right eye 4 (four) times daily for 7 days.    Dispense:  10 mL    Refill:  0       Return in about 2 weeks (around 07/05/2019) for f/u HST OD, DFE, OCT.  There are no Patient Instructions on file for this visit.   Explained the diagnoses, plan, and follow up with the patient and they expressed understanding.  Patient expressed understanding of the  importance of proper follow up care.    This document serves as a record of services personally performed by Gardiner Sleeper, MD, PhD. It was created on their behalf by Ernest Mallick, OA, an ophthalmic assistant. The creation of this record is the provider's dictation and/or activities during the visit.    Electronically signed by: Ernest Mallick, OA 06.16.2021 3:18 PM  Gardiner Sleeper, M.D., Ph.D. Diseases & Surgery of the Retina and Vitreous [facility]  I have reviewed the above documentation for accuracy and completeness, and I agree with the above. Gardiner Sleeper, M.D., Ph.D. 06/22/19 3:18 PM   Abbreviations: M myopia (nearsighted); A astigmatism; H hyperopia (farsighted); P presbyopia; Mrx spectacle prescription;  CTL contact lenses; OD right eye; OS left eye; OU both eyes  XT exotropia; ET esotropia; PEK punctate epithelial keratitis; PEE punctate epithelial erosions; DES dry eye syndrome; MGD meibomian gland dysfunction; ATs artificial tears; PFAT's preservative free artificial tears; [place] nuclear sclerotic cataract; PSC posterior subcapsular cataract; ERM epi-retinal membrane; PVD posterior vitreous detachment; RD retinal detachment; DM diabetes mellitus; DR diabetic retinopathy; NPDR non-proliferative diabetic retinopathy; PDR proliferative diabetic retinopathy; CSME clinically significant macular edema; DME diabetic macular edema; dbh dot blot hemorrhages; CWS cotton wool spot; POAG primary open angle glaucoma; C/D cup-to-disc ratio; HVF humphrey visual field; GVF goldmann visual field; OCT optical coherence tomography; IOP intraocular pressure; BRVO Branch retinal vein occlusion; CRVO central retinal vein occlusion; CRAO central retinal artery occlusion; BRAO branch retinal artery occlusion; RT retinal tear; SB scleral buckle; PPV pars plana vitrectomy; VH Vitreous hemorrhage; PRP panretinal laser photocoagulation; IVK intravitreal kenalog; VMT vitreomacular traction;  MH Macular hole;  NVD neovascularization of the disc; NVE neovascularization elsewhere; AREDS age related eye disease study; ARMD age related macular degeneration; POAG primary open angle glaucoma; EBMD epithelial/anterior basement membrane dystrophy; ACIOL anterior chamber intraocular lens; IOL intraocular lens; PCIOL posterior chamber intraocular lens; Phaco/IOL phacoemulsification with intraocular lens placement; [place] photorefractive keratectomy; LASIK laser assisted in situ keratomileusis; HTN hypertension; DM diabetes mellitus; COPD chronic obstructive pulmonary disease

## 2019-06-22 DIAGNOSIS — R739 Hyperglycemia, unspecified: Secondary | ICD-10-CM | POA: Diagnosis not present

## 2019-06-22 DIAGNOSIS — E7849 Other hyperlipidemia: Secondary | ICD-10-CM | POA: Diagnosis not present

## 2019-06-22 DIAGNOSIS — R82998 Other abnormal findings in urine: Secondary | ICD-10-CM | POA: Diagnosis not present

## 2019-06-22 DIAGNOSIS — I10 Essential (primary) hypertension: Secondary | ICD-10-CM | POA: Diagnosis not present

## 2019-06-22 DIAGNOSIS — Z0000 Encounter for general adult medical examination without abnormal findings: Secondary | ICD-10-CM | POA: Diagnosis not present

## 2019-06-22 DIAGNOSIS — G500 Trigeminal neuralgia: Secondary | ICD-10-CM | POA: Diagnosis not present

## 2019-06-22 DIAGNOSIS — Z1212 Encounter for screening for malignant neoplasm of rectum: Secondary | ICD-10-CM | POA: Diagnosis not present

## 2019-07-03 NOTE — Progress Notes (Addendum)
[facility] Note  07/05/2019     CHIEF COMPLAINT Patient presents for Retina Follow Up   HISTORY OF PRESENT ILLNESS: Heather Jenkins is a 64 y.o. female who presents to the clinic today for:   HPI    Retina Follow Up    Patient presents with  Other.  In right eye.  This started 2 weeks ago.  Severity is moderate.  I, the attending physician,  performed the HPI with the patient and updated documentation appropriately.          Comments    Patient here for 2 weeks retina follow up for HST OD. Patient states vision doing fine. No eye pain.       Last edited by Bernarda Caffey, MD on 07/09/2019  1:57 AM. (History)    pt states no problems after laser procedure at last visit, she states she still has the same floaters, but no fol   Referring physician: Michael Boston, MD [place],  [place] [address]  HISTORICAL INFORMATION:   Selected notes from the MEDICAL RECORD NUMBER Referred by Dr. Satira Sark for retinal tear OD   CURRENT MEDICATIONS: No current outpatient medications on file. (Ophthalmic Drugs)   No current facility-administered medications for this visit. (Ophthalmic Drugs)   Current Outpatient Medications (Other)  Medication Sig  . atorvastatin (LIPITOR) 10 MG tablet Take 10 mg by mouth daily.  . Cholecalciferol (VITAMIN D3 PO) Take 1,000 Units by mouth daily.  . fexofenadine-pseudoephedrine (ALLEGRA-D 24) 180-240 MG 24 hr tablet Take 1 tablet by mouth daily as needed.  . gabapentin (NEURONTIN) 300 MG capsule Take 2 capsules (600 mg total) by mouth 3 (three) times daily.  . hydrochlorothiazide (HYDRODIURIL) 25 MG tablet Take 25 mg by mouth daily.  Marland Kitchen losartan (COZAAR) 100 MG tablet Take 100 mg by mouth daily.  . Oxcarbazepine (TRILEPTAL) 300 MG tablet Take 1 tablet (300 mg total) by mouth 2 (two) times daily.   No current facility-administered medications for this visit. (Other)      REVIEW OF SYSTEMS: ROS    Positive for:  Eyes   Negative for: Constitutional, Gastrointestinal, Neurological, Skin, Genitourinary, Musculoskeletal, HENT, Endocrine, Cardiovascular, Respiratory, Psychiatric, Allergic/Imm, Heme/Lymph   Last edited by Theodore Demark, COA on 07/05/2019  1:23 PM. (History)       ALLERGIES Allergies  Allergen Reactions  . Penicillins Hives    PAST MEDICAL HISTORY Past Medical History:  Diagnosis Date  . Chronic maxillary sinusitis   . History of kidney stones   . Hyperlipemia   . Hypertension   . Left facial pain    Past Surgical History:  Procedure Laterality Date  . RECTOCELE REPAIR    . TAH w/ single oophorectomy      FAMILY HISTORY Family History  Problem Relation Age of Onset  . Ovarian cancer Mother   . Suicidality Father        age 48  . Melanoma Sister     SOCIAL HISTORY Social History   Tobacco Use  . Smoking status: Never Smoker  . Smokeless tobacco: Never Used  Substance Use Topics  . Alcohol use: Yes    Comment: rare  . Drug use: Never         OPHTHALMIC EXAM:  Base Eye Exam    Visual Acuity (Snellen - Linear)      Right Left   Dist cc 20/30 -2 20/25 +1   Dist ph cc 20/25 -2 20/20  Correction: Contacts       Tonometry (Tonopen, 1:21 PM)      Right Left   Pressure 19 18       Pupils      Dark Light Shape React APD   Right 4 3 Round Brisk None   Left 4 3 Round Brisk None       Visual Fields (Counting fingers)      Left Right    Full Full       Extraocular Movement      Right Left    Full, Ortho Full, Ortho       Neuro/Psych    Oriented x3: Yes   Mood/Affect: Normal       Dilation    Both eyes: 1.0% Mydriacyl, 2.5% Phenylephrine @ 1:20 PM        Slit Lamp and Fundus Exam    Slit Lamp Exam      Right Left   Lids/Lashes Dermatochalasis - upper lid Dermatochalasis - upper lid   Conjunctiva/Sclera White and quiet White and quiet   Cornea Trace Punctate epithelial erosions Trace Punctate epithelial erosions   Anterior  Chamber Deep and quiet Deep and quiet   Iris Round and dilated Round and dilated   Lens 1-2+ Nuclear sclerosis, 1-2+ Cortical cataract 1-2+ Nuclear sclerosis, 1-2+ Cortical cataract   Vitreous Vitreous syneresis, no pigment, Posterior vitreous detachment, Weiss ring Vitreous syneresis, no pigment       Fundus Exam      Right Left   Disc Pink and Sharp, temporal PPA, tilted, Compact Pink and Sharp, temporal PPA   C/D Ratio 0.4 0.4   Macula Flat, Blunted foveal reflex, mild RPE mottling and clumping, No heme or edema Flat, good foveal reflex, mild RPE mottling and clumping, No heme or edema   Vessels Vascular attenuation, mild tortuousity Vascular attenuation, mild tortuousity, mild AV crossing changes   Periphery Attached, pigmented paving stone degeneration inferiorly at 0600, HST at 1230 with small cuff of SRF -- good laser surrounding Attached, focal pigment clump at 0430 equator, 2 small patches of pigmented lattice at 0600, mild pigmented paving stone degeneration inferiorly at 0600        Refraction    Wearing Rx      Sphere Cylinder Axis Add   Right -9.00 Sphere  +2.25   Left -7.25 +0.50 076 +2.25   Type: PAL          IMAGING AND PROCEDURES  Imaging and Procedures for @TODAY @  OCT, Retina - OU - Both Eyes       Right Eye Quality was good. Central Foveal Thickness: 232. Progression has been stable. Findings include normal foveal contour, no IRF, no SRF, myopic contour.   Left Eye Quality was good. Central Foveal Thickness: 232. Progression has been stable. Findings include normal foveal contour, no IRF, no SRF, myopic contour, vitreomacular adhesion .   Notes *Images captured and stored on drive  Diagnosis / Impression:  NFP, no IRF/SRF OU Myopic contour OU  Clinical management:  See below  Abbreviations: NFP - Normal foveal profile. CME - cystoid macular edema. PED - pigment epithelial detachment. IRF - intraretinal fluid. SRF - subretinal fluid. EZ - ellipsoid  zone. ERM - epiretinal membrane. ORA - outer retinal atrophy. ORT - outer retinal tubulation. SRHM - subretinal hyper-reflective material                 ASSESSMENT/PLAN:    ICD-10-CM   1. Retinal tear of right eye  H33.311   2. Right retinal detachment  H33.21   3. Retinal edema  H35.81 OCT, Retina - OU - Both Eyes  4. Lattice degeneration of left retina  H35.412   5. Retinal hole of left eye  H33.322   6. Posterior vitreous detachment of right eye  H43.811   7. Essential hypertension  I10   8. Hypertensive retinopathy of both eyes  H35.033   9. Combined forms of age-related cataract of both eyes  H25.813    1-3. Horseshoe tear w/ cuff of SRF / focal RD, OD  - HST located at 1230 with cuff of SRF  - s/p laser retinopexy OD (06.16.21) - good laser surrounding  - completed PF qid OD x7 days  - f/u 2 weeks, DFE, OCT  4,5. Lattice degeneration w/ atrophic holes, OS  - 2 small patches of pigmented lattice at 0600 - discussed findings, prognosis, and treatment options including observation - recommend laser retinopexy OS in 2 weeks - f/u in 2 wks, sooner prn -- POV  6. PVD / vitreous syneresis OD  - pt reports appearance of floaters back in November 2020 and was seen by Dr. Emily Filbert  - Discussed findings and prognosis  - RT/focal RD identified OD as above  - Reviewed s/s of RT/RD  - Strict return precautions for any such RT/RD signs/symptoms  - monitor  7,8. Hypertensive retinopathy OU  - discussed importance of tight BP control  - monitor  9. Mixed form cataract  - under the expert management of Dr. Burgess Estelle  - not yet visually signficant    Ophthalmic Meds Ordered this visit:  No orders of the defined types were placed in this encounter.      Return in about 2 weeks (around 07/19/2019) for f/u lattice dengeneration OU, DFE, OCT.  There are no Patient Instructions on file for this visit.   Explained the diagnoses, plan, and follow up with the patient and they  expressed understanding.  Patient expressed understanding of the importance of proper follow up care.    This document serves as a record of services personally performed by Karie Chimera, MD, PhD. It was created on their behalf by Annalee Genta, COMT. The creation of this record is the provider's dictation and/or activities during the visit.  Electronically signed by: Annalee Genta, COMT 07/09/19 2:02 AM  This document serves as a record of services personally performed by Karie Chimera, MD, PhD. It was created on their behalf by Glee Arvin. Manson Passey, COT, an ophthalmic technician. The creation of this record is the provider's dictation and/or activities during the visit.    Electronically signed by: Glee Arvin. Manson Passey, COT 06.30.2021 2:02 AM   Karie Chimera, M.D., Ph.D. Diseases & Surgery of the Retina and Vitreous [facility]  I have reviewed the above documentation for accuracy and completeness, and I agree with the above. Karie Chimera, M.D., Ph.D. 07/09/19 2:02 AM   Abbreviations: M myopia (nearsighted); A astigmatism; H hyperopia (farsighted); P presbyopia; Mrx spectacle prescription;  CTL contact lenses; OD right eye; OS left eye; OU both eyes  XT exotropia; ET esotropia; PEK punctate epithelial keratitis; PEE punctate epithelial erosions; DES dry eye syndrome; MGD meibomian gland dysfunction; ATs artificial tears; PFAT's preservative free artificial tears; [place] nuclear sclerotic cataract; PSC posterior subcapsular cataract; ERM epi-retinal membrane; PVD posterior vitreous detachment; RD retinal detachment; DM diabetes mellitus; DR diabetic retinopathy; NPDR non-proliferative diabetic retinopathy; PDR proliferative diabetic retinopathy; CSME clinically significant macular  edema; DME diabetic macular edema; dbh dot blot hemorrhages; CWS cotton wool spot; POAG primary open angle glaucoma; C/D cup-to-disc ratio; HVF humphrey visual field; GVF goldmann visual field; OCT  optical coherence tomography; IOP intraocular pressure; BRVO Branch retinal vein occlusion; CRVO central retinal vein occlusion; CRAO central retinal artery occlusion; BRAO branch retinal artery occlusion; RT retinal tear; SB scleral buckle; PPV pars plana vitrectomy; VH Vitreous hemorrhage; PRP panretinal laser photocoagulation; IVK intravitreal kenalog; VMT vitreomacular traction; MH Macular hole;  NVD neovascularization of the disc; NVE neovascularization elsewhere; AREDS age related eye disease study; ARMD age related macular degeneration; POAG primary open angle glaucoma; EBMD epithelial/anterior basement membrane dystrophy; ACIOL anterior chamber intraocular lens; IOL intraocular lens; PCIOL posterior chamber intraocular lens; Phaco/IOL phacoemulsification with intraocular lens placement; PRK photorefractive keratectomy; LASIK laser assisted in situ keratomileusis; HTN hypertension; DM diabetes mellitus; COPD chronic obstructive pulmonary disease

## 2019-07-05 ENCOUNTER — Other Ambulatory Visit: Payer: Self-pay

## 2019-07-05 ENCOUNTER — Encounter (INDEPENDENT_AMBULATORY_CARE_PROVIDER_SITE_OTHER): Payer: Self-pay | Admitting: Ophthalmology

## 2019-07-05 ENCOUNTER — Ambulatory Visit (INDEPENDENT_AMBULATORY_CARE_PROVIDER_SITE_OTHER): Payer: BC Managed Care – PPO | Admitting: Ophthalmology

## 2019-07-05 DIAGNOSIS — H33311 Horseshoe tear of retina without detachment, right eye: Secondary | ICD-10-CM

## 2019-07-05 DIAGNOSIS — H35412 Lattice degeneration of retina, left eye: Secondary | ICD-10-CM

## 2019-07-05 DIAGNOSIS — H3321 Serous retinal detachment, right eye: Secondary | ICD-10-CM | POA: Diagnosis not present

## 2019-07-05 DIAGNOSIS — H3581 Retinal edema: Secondary | ICD-10-CM

## 2019-07-05 DIAGNOSIS — H35033 Hypertensive retinopathy, bilateral: Secondary | ICD-10-CM

## 2019-07-05 DIAGNOSIS — H25813 Combined forms of age-related cataract, bilateral: Secondary | ICD-10-CM

## 2019-07-05 DIAGNOSIS — H33322 Round hole, left eye: Secondary | ICD-10-CM

## 2019-07-05 DIAGNOSIS — H43811 Vitreous degeneration, right eye: Secondary | ICD-10-CM

## 2019-07-05 DIAGNOSIS — I10 Essential (primary) hypertension: Secondary | ICD-10-CM

## 2019-07-09 ENCOUNTER — Encounter (INDEPENDENT_AMBULATORY_CARE_PROVIDER_SITE_OTHER): Payer: Self-pay | Admitting: Ophthalmology

## 2019-07-12 DIAGNOSIS — Z6833 Body mass index (BMI) 33.0-33.9, adult: Secondary | ICD-10-CM | POA: Diagnosis not present

## 2019-07-12 DIAGNOSIS — Z01419 Encounter for gynecological examination (general) (routine) without abnormal findings: Secondary | ICD-10-CM | POA: Diagnosis not present

## 2019-07-14 ENCOUNTER — Ambulatory Visit
Admission: RE | Admit: 2019-07-14 | Discharge: 2019-07-14 | Disposition: A | Discharge status: Home / Self Care | Payer: BC Managed Care – PPO | Source: Ambulatory Visit | Attending: Obstetrics and Gynecology | Admitting: Obstetrics and Gynecology

## 2019-07-14 ENCOUNTER — Other Ambulatory Visit: Payer: Self-pay

## 2019-07-14 DIAGNOSIS — Z1231 Encounter for screening mammogram for malignant neoplasm of breast: Secondary | ICD-10-CM

## 2019-07-24 ENCOUNTER — Encounter (INDEPENDENT_AMBULATORY_CARE_PROVIDER_SITE_OTHER): Payer: BC Managed Care – PPO | Admitting: Ophthalmology

## 2019-08-09 NOTE — Progress Notes (Signed)
[facility] - Clinic Note  08/14/2019     CHIEF COMPLAINT Patient presents for Retina Follow Up   HISTORY OF PRESENT ILLNESS: Heather Jenkins is a 64 y.o. female who presents to the clinic today for:   HPI    Retina Follow Up    Patient presents with  Retinal Break/Detachment.  In right eye.  This started weeks ago.  Severity is moderate.  Duration of weeks.  Since onset it is stable.  I, the attending physician,  performed the HPI with the patient and updated documentation appropriately.          Comments    Pt states vision is the same in both eyes.  Patient denies eye pain or discomfort OU. Patient denies any new or worsening floaters or fol OU.       Last edited by Rennis Chris, MD on 08/14/2019  2:48 PM. (History)    pt is worried about having laser OS bc of her trigeminal neuralgia   Referring physician: Janet Berlin, MD [address] [place],  [place] [address]  HISTORICAL INFORMATION:   Selected notes from the MEDICAL RECORD NUMBER Referred by Dr. Burgess Estelle for retinal tear OD   CURRENT MEDICATIONS: No current outpatient medications on file. (Ophthalmic Drugs)   No current facility-administered medications for this visit. (Ophthalmic Drugs)   Current Outpatient Medications (Other)  Medication Sig  . atorvastatin (LIPITOR) 10 MG tablet Take 10 mg by mouth daily.  . Cholecalciferol (VITAMIN D3 PO) Take 1,000 Units by mouth daily.  . fexofenadine-pseudoephedrine (ALLEGRA-D 24) 180-240 MG 24 hr tablet Take 1 tablet by mouth daily as needed.  . gabapentin (NEURONTIN) 300 MG capsule Take 2 capsules (600 mg total) by mouth 3 (three) times daily.  . hydrochlorothiazide (HYDRODIURIL) 25 MG tablet Take 25 mg by mouth daily.  Marland Kitchen losartan (COZAAR) 100 MG tablet Take 100 mg by mouth daily.  . Oxcarbazepine (TRILEPTAL) 300 MG tablet Take 1 tablet (300 mg total) by mouth 2 (two) times daily.   No current facility-administered medications for this  visit. (Other)      REVIEW OF SYSTEMS: ROS    Positive for: Eyes   Negative for: Constitutional, Gastrointestinal, Neurological, Skin, Genitourinary, Musculoskeletal, HENT, Endocrine, Cardiovascular, Respiratory, Psychiatric, Allergic/Imm, Heme/Lymph   Last edited by Corrinne Eagle on 08/14/2019  2:07 PM. (History)       ALLERGIES Allergies  Allergen Reactions  . Penicillins Hives    PAST MEDICAL HISTORY Past Medical History:  Diagnosis Date  . Chronic maxillary sinusitis   . History of kidney stones   . Hyperlipemia   . Hypertension   . Left facial pain    Past Surgical History:  Procedure Laterality Date  . RECTOCELE REPAIR    . TAH w/ single oophorectomy      FAMILY HISTORY Family History  Problem Relation Age of Onset  . Ovarian cancer Mother   . Suicidality Father        age 59  . Melanoma Sister   . Breast cancer Neg Hx     SOCIAL HISTORY Social History   Tobacco Use  . Smoking status: Never Smoker  . Smokeless tobacco: Never Used  Substance Use Topics  . Alcohol use: Yes    Comment: rare  . Drug use: Never         OPHTHALMIC EXAM:  Base Eye Exam    Visual Acuity (Snellen - Linear)      Right Left   Dist  cc 20/40 -1 20/30 -1   Dist ph cc 20/30 +2 NI   Correction: Contacts       Tonometry (Tonopen, 2:14 PM)      Right Left   Pressure 16 14       Pupils      Dark Light Shape React APD   Right 4 3 Round Brisk 0   Left 4 3 Round Brisk 0       Visual Fields      Left Right    Full Full       Extraocular Movement      Right Left    Full Full       Neuro/Psych    Oriented x3: Yes   Mood/Affect: Normal       Dilation    Both eyes: 1.0% Mydriacyl, 2.5% Phenylephrine @ 2:15 PM        Slit Lamp and Fundus Exam    Slit Lamp Exam      Right Left   Lids/Lashes Dermatochalasis - upper lid Dermatochalasis - upper lid   Conjunctiva/Sclera White and quiet White and quiet   Cornea Trace Punctate epithelial erosions Trace  Punctate epithelial erosions   Anterior Chamber Deep and quiet Deep and quiet   Iris Round and dilated Round and dilated   Lens 1-2+ Nuclear sclerosis, 1-2+ Cortical cataract 1-2+ Nuclear sclerosis, 1-2+ Cortical cataract   Vitreous Vitreous syneresis, no pigment, Posterior vitreous detachment, Weiss ring Vitreous syneresis, no pigment       Fundus Exam      Right Left   Disc Pink and Sharp, temporal PPA, tilted, Compact Pink and Sharp, temporal PPA   C/D Ratio 0.4 0.4   Macula Flat, Blunted foveal reflex, mild RPE mottling and clumping, No heme or edema Flat, good foveal reflex, mild RPE mottling and clumping, No heme or edema   Vessels Vascular attenuation, mild tortuousity Vascular attenuation, mild tortuousity, mild AV crossing changes   Periphery Attached, pigmented paving stone degeneration inferiorly at 0600, HST at 1230 with small cuff of SRF -- good laser surrounding Attached, focal pigment VR tuft at 0430 equator, pigmented lattice from 5-7 oclock, mild pigmented paving stone degeneration inferior          IMAGING AND PROCEDURES  Imaging and Procedures for @TODAY @  OCT, Retina - OU - Both Eyes       Right Eye Quality was good. Central Foveal Thickness: 224. Progression has been stable. Findings include normal foveal contour, no IRF, no SRF, myopic contour.   Left Eye Quality was good. Central Foveal Thickness: 232. Progression has been stable. Findings include normal foveal contour, no IRF, no SRF, myopic contour, vitreomacular adhesion .   Notes *Images captured and stored on drive  Diagnosis / Impression:  NFP, no IRF/SRF OU Myopic contour OU  Clinical management:  See below  Abbreviations: NFP - Normal foveal profile. CME - cystoid macular edema. PED - pigment epithelial detachment. IRF - intraretinal fluid. SRF - subretinal fluid. EZ - ellipsoid zone. ERM - epiretinal membrane. ORA - outer retinal atrophy. ORT - outer retinal tubulation. SRHM - subretinal  hyper-reflective material        Repair Retinal Breaks, Laser - OS - Left Eye       LASER PROCEDURE NOTE  Procedure:  Barrier laser retinopexy using slit lamp laser, LEFT eye   Diagnosis:   Lattice degeneration w/ atrophic holes, LEFT eye  Inferior patch of lattice: 0500-0700                         Pigmented VR tuft at 0430  Surgeon: Rennis ChrisBrian Reveca Desmarais, MD, PhD  Anesthesia: Topical  Informed consent obtained, operative eye marked, and time out performed prior to initiation of laser.   Laser settings:  Lumenis Smart532 laser, slit lamp Lens: Mainster PRP 165 Power: 280 mW Spot size: 200 microns Duration: 30 msec  # spots: 412  Placement of laser: Using a Mainster PRP 165 contact lens at the slit lamp, laser was placed in three confluent rows around patches of lattice w/ atrophic holes from 5-7 oclock and the pigmented VR tuft at 0430.  Complications: None.  Patient tolerated the procedure well and received written and verbal post-procedure care information/education.                 ASSESSMENT/PLAN:    ICD-10-CM   1. Retinal tear of right eye  H33.311   2. Right retinal detachment  H33.21   3. Retinal edema  H35.81 OCT, Retina - OU - Both Eyes  4. Lattice degeneration of left retina  H35.412 Repair Retinal Breaks, Laser - OS - Left Eye  5. Retinal hole of left eye  H33.322 Repair Retinal Breaks, Laser - OS - Left Eye  6. Posterior vitreous detachment of right eye  H43.811   7. Essential hypertension  I10   8. Hypertensive retinopathy of both eyes  H35.033   9. Combined forms of age-related cataract of both eyes  H25.813    1-3. Horseshoe tear w/ cuff of SRF / focal RD, OD  - HST located at 1230 with cuff of SRF  - s/p laser retinopexy OD (06.16.21) -- good laser surrounding  - completed PF qid OD x7 days  - f/u 3 weeks, DFE, OCT  4,5. Lattice degeneration w/ atrophic holes, left eye - pigmented lattice from 5-7 oclock - discussed  findings, prognosis, and treatment options including observation - recommend laser retinopexy OS today, 08.09.21 - pt wishes to proceed with laser - RBA of procedure discussed, questions answered - informed consent obtained and signed - see procedure note - start PF QID OS x7 days - f/u in 3 wks, sooner prn -- POV  6. PVD / vitreous syneresis OD  - pt reports appearance of floaters back in November 2020 and was seen by Dr. Emily FilbertGould  - Discussed findings and prognosis  - RT/focal RD identified OD as above  - Reviewed s/s of RT/RD  - Strict return precautions for any such RT/RD signs/symptoms  - monitor  7,8. Hypertensive retinopathy OU  - discussed importance of tight BP control  - monitor  9. Mixed form cataract OU  - under the expert management of Dr. Burgess Estelleanner  - not yet visually significant     Ophthalmic Meds Ordered this visit:  No orders of the defined types were placed in this encounter.      Return in about 3 weeks (around 09/04/2019) for f/u 3 weeks, lattice degeneration OS.  There are no Patient Instructions on file for this visit.   Explained the diagnoses, plan, and follow up with the patient and they expressed understanding.  Patient expressed understanding of the importance of proper follow up care.    This document serves as a record of services personally performed by Karie ChimeraBrian G. Verner Mccrone, MD, PhD. It was created on their behalf by Joni Reiningobin Hodges, an ophthalmic technician. The creation  of this record is the provider's dictation and/or activities during the visit.    Electronically signed by: Joni Reining COA, 08/14/19  3:38 PM   This document serves as a record of services personally performed by Karie Chimera, MD, PhD. It was created on their behalf by Glee Arvin. Manson Passey, OA an ophthalmic technician. The creation of this record is the provider's dictation and/or activities during the visit.    Electronically signed by: Glee Arvin. Manson Passey, [place] 08.09.2021 3:38 PM   Karie Chimera, M.D., Ph.D. Diseases & Surgery of the Retina and Vitreous [facility]  I have reviewed the above documentation for accuracy and completeness, and I agree with the above. Karie Chimera, M.D., Ph.D. 08/14/19 3:38 PM    Abbreviations: M myopia (nearsighted); A astigmatism; H hyperopia (farsighted); P presbyopia; Mrx spectacle prescription;  CTL contact lenses; OD right eye; OS left eye; OU both eyes  XT exotropia; ET esotropia; PEK punctate epithelial keratitis; PEE punctate epithelial erosions; DES dry eye syndrome; MGD meibomian gland dysfunction; ATs artificial tears; PFAT's preservative free artificial tears; NSC nuclear sclerotic cataract; PSC posterior subcapsular cataract; ERM epi-retinal membrane; PVD posterior vitreous detachment; RD retinal detachment; DM diabetes mellitus; DR diabetic retinopathy; NPDR non-proliferative diabetic retinopathy; PDR proliferative diabetic retinopathy; CSME clinically significant macular edema; DME diabetic macular edema; dbh dot blot hemorrhages; CWS cotton wool spot; POAG primary open angle glaucoma; C/D cup-to-disc ratio; HVF humphrey visual field; GVF goldmann visual field; OCT optical coherence tomography; IOP intraocular pressure; BRVO Branch retinal vein occlusion; CRVO central retinal vein occlusion; CRAO central retinal artery occlusion; BRAO branch retinal artery occlusion; RT retinal tear; SB scleral buckle; PPV pars plana vitrectomy; VH Vitreous hemorrhage; PRP panretinal laser photocoagulation; IVK intravitreal kenalog; VMT vitreomacular traction; MH Macular hole;  NVD neovascularization of the disc; NVE neovascularization elsewhere; AREDS age related eye disease study; ARMD age related macular degeneration; POAG primary open angle glaucoma; EBMD epithelial/anterior basement membrane dystrophy; ACIOL anterior chamber intraocular lens; IOL intraocular lens; PCIOL posterior chamber intraocular lens; Phaco/IOL  phacoemulsification with intraocular lens placement; PRK photorefractive keratectomy; LASIK laser assisted in situ keratomileusis; HTN hypertension; DM diabetes mellitus; COPD chronic obstructive pulmonary disease

## 2019-08-14 ENCOUNTER — Ambulatory Visit (INDEPENDENT_AMBULATORY_CARE_PROVIDER_SITE_OTHER): Payer: BC Managed Care – PPO | Admitting: Ophthalmology

## 2019-08-14 ENCOUNTER — Other Ambulatory Visit: Payer: Self-pay

## 2019-08-14 ENCOUNTER — Encounter (INDEPENDENT_AMBULATORY_CARE_PROVIDER_SITE_OTHER): Payer: Self-pay | Admitting: Ophthalmology

## 2019-08-14 DIAGNOSIS — H25813 Combined forms of age-related cataract, bilateral: Secondary | ICD-10-CM

## 2019-08-14 DIAGNOSIS — H43811 Vitreous degeneration, right eye: Secondary | ICD-10-CM

## 2019-08-14 DIAGNOSIS — H33322 Round hole, left eye: Secondary | ICD-10-CM

## 2019-08-14 DIAGNOSIS — H3581 Retinal edema: Secondary | ICD-10-CM | POA: Diagnosis not present

## 2019-08-14 DIAGNOSIS — I10 Essential (primary) hypertension: Secondary | ICD-10-CM

## 2019-08-14 DIAGNOSIS — H3321 Serous retinal detachment, right eye: Secondary | ICD-10-CM

## 2019-08-14 DIAGNOSIS — H35412 Lattice degeneration of retina, left eye: Secondary | ICD-10-CM | POA: Diagnosis not present

## 2019-08-14 DIAGNOSIS — H35033 Hypertensive retinopathy, bilateral: Secondary | ICD-10-CM

## 2019-08-14 DIAGNOSIS — H33311 Horseshoe tear of retina without detachment, right eye: Secondary | ICD-10-CM | POA: Diagnosis not present

## 2019-08-16 DIAGNOSIS — Z8041 Family history of malignant neoplasm of ovary: Secondary | ICD-10-CM | POA: Diagnosis not present

## 2019-08-24 ENCOUNTER — Telehealth: Payer: Self-pay | Admitting: Neurology

## 2019-08-24 NOTE — Telephone Encounter (Signed)
I called the patient.  The patient has had a couple of sharp jabs of pain on the left V2 distribution consistent with her trigeminal neuralgia.  She currently is only taking 600 mg twice a day of the gabapentin, she will go up to the 600 mg 3 times daily and remain on Trileptal.  She will call if the pain worsens.

## 2019-08-24 NOTE — Telephone Encounter (Signed)
Pt having pain on left side of her face. Pt said pain is not really bad, but would like head pain off because I know where it is going. Would like the nurse to call her

## 2019-08-24 NOTE — Telephone Encounter (Signed)
Spoke to pt and she is very anxious that she will have or is starting with a flare, has had 2 episodes of sharp pain shooting on side of left nose.  She states her face feels puffy and this is something that she feels is a trigger although sinus issues have been ruled out.  inflammation somewhere that is causing problem. No drainage congestion. Asking for guidance. She is taking oxcarbamazepine 300mg  po bid, and gabapentin 600mg  po bid now (not TID) as ordered (per rec from pcp).  Would taking sinus med help condition, wants to see ENT who we recommend?  Has taken steroids in past. I relayed to increase her gabapentin to 600mg  po tid.  Will forward to [facility] for suggestions since SS and YY/MD out.

## 2019-08-31 ENCOUNTER — Telehealth: Payer: Self-pay | Admitting: Neurology

## 2019-08-31 MED ORDER — OXCARBAZEPINE 300 MG PO TABS
450.0000 mg | ORAL_TABLET | Freq: Two times a day (BID) | ORAL | 11 refills | Status: DC
Start: 2019-08-31 — End: 2019-10-04

## 2019-08-31 NOTE — Progress Notes (Addendum)
[facility] - Clinic Note  09/04/2019     CHIEF COMPLAINT Patient presents for Retina Follow Up   HISTORY OF PRESENT ILLNESS: Heather Jenkins is a 64 y.o. female who presents to the clinic today for:   HPI    Retina Follow Up    Patient presents with  Other.  In left eye.  This started 3 weeks ago.  Severity is moderate.  I, the attending physician,  performed the HPI with the patient and updated documentation appropriately.          Comments    Patient here for 3 weeks follow up for lattice deg. OS. Patient states vision doing fine. No eye pain.        Last edited by Rennis Chris, MD on 09/04/2019  5:01 PM. (History)    pt states her trigeminal neuralgia came back one week after she had the last laser procedure, she is on medication for it, she sees her neurologist in September  Referring physician: Janet Berlin, MD [address] [place],  [place] [address]  HISTORICAL INFORMATION:   Selected notes from the MEDICAL RECORD NUMBER Referred by Dr. Burgess Estelle for retinal tear OD   CURRENT MEDICATIONS: No current outpatient medications on file. (Ophthalmic Drugs)   No current facility-administered medications for this visit. (Ophthalmic Drugs)   Current Outpatient Medications (Other)  Medication Sig  . atorvastatin (LIPITOR) 10 MG tablet Take 10 mg by mouth daily.  . Cholecalciferol (VITAMIN D3 PO) Take 1,000 Units by mouth daily.  . fexofenadine-pseudoephedrine (ALLEGRA-D 24) 180-240 MG 24 hr tablet Take 1 tablet by mouth daily as needed.  . gabapentin (NEURONTIN) 300 MG capsule Take 2 capsules (600 mg total) by mouth 3 (three) times daily.  . hydrochlorothiazide (HYDRODIURIL) 25 MG tablet Take 25 mg by mouth daily.  Marland Kitchen losartan (COZAAR) 100 MG tablet Take 100 mg by mouth daily.  . Oxcarbazepine (TRILEPTAL) 300 MG tablet Take 1.5 tablets (450 mg total) by mouth 2 (two) times daily.   No current facility-administered medications for this visit.  (Other)      REVIEW OF SYSTEMS: ROS    Positive for: Eyes   Negative for: Constitutional, Gastrointestinal, Neurological, Skin, Genitourinary, Musculoskeletal, HENT, Endocrine, Cardiovascular, Respiratory, Psychiatric, Allergic/Imm, Heme/Lymph   Last edited by Laddie Aquas, COA on 09/04/2019  8:37 AM. (History)       ALLERGIES Allergies  Allergen Reactions  . Penicillins Hives    PAST MEDICAL HISTORY Past Medical History:  Diagnosis Date  . Chronic maxillary sinusitis   . History of kidney stones   . Hyperlipemia   . Hypertension   . Left facial pain    Past Surgical History:  Procedure Laterality Date  . RECTOCELE REPAIR    . TAH w/ single oophorectomy      FAMILY HISTORY Family History  Problem Relation Age of Onset  . Ovarian cancer Mother   . Suicidality Father        age 21  . Melanoma Sister   . Breast cancer Neg Hx     SOCIAL HISTORY Social History   Tobacco Use  . Smoking status: Never Smoker  . Smokeless tobacco: Never Used  Substance Use Topics  . Alcohol use: Yes    Comment: rare  . Drug use: Never         OPHTHALMIC EXAM:  Base Eye Exam    Visual Acuity (Snellen - Linear)      Right Left   Dist cc  20/25 -2 20/25 -1   Dist ph cc NI NI   Correction: Glasses       Tonometry (Tonopen, 8:35 AM)      Right Left   Pressure 18 16       Pupils      Dark Light Shape React APD   Right 4 3 Round Brisk None   Left 4 3 Round Brisk None       Visual Fields (Counting fingers)      Left Right    Full Full       Extraocular Movement      Right Left    Full, Ortho Full, Ortho       Neuro/Psych    Oriented x3: Yes   Mood/Affect: Normal       Dilation    Both eyes: 1.0% Mydriacyl, 2.5% Phenylephrine @ 8:34 AM        Slit Lamp and Fundus Exam    Slit Lamp Exam      Right Left   Lids/Lashes Dermatochalasis - upper lid Dermatochalasis - upper lid   Conjunctiva/Sclera White and quiet White and quiet   Cornea Trace  Punctate epithelial erosions Trace Punctate epithelial erosions   Anterior Chamber Deep and quiet Deep and quiet   Iris Round and dilated Round and dilated   Lens 1-2+ Nuclear sclerosis, 1-2+ Cortical cataract 1-2+ Nuclear sclerosis, 1-2+ Cortical cataract   Vitreous Vitreous syneresis, no pigment, Posterior vitreous detachment, Weiss ring Vitreous syneresis, no pigment       Fundus Exam      Right Left   Disc Pink and Sharp, temporal PPA, tilted, Compact Pink and Sharp, temporal PPA   C/D Ratio 0.4 0.4   Macula Flat, Blunted foveal reflex, mild RPE mottling and clumping, No heme or edema Flat, good foveal reflex, mild RPE mottling and clumping, No heme or edema   Vessels Vascular attenuation, mild tortuousity Vascular attenuation, mild tortuousity, mild AV crossing changes   Periphery Attached, pigmented paving stone degeneration inferiorly at 0600, HST at 1230 with small cuff of SRF -- good laser surrounding Attached, focal pigment VR tuft at 0430 equator, pigmented lattice from 5-7 oclock, -- good laser surrounding all lesions, mild pigmented paving stone degeneration inferior        Refraction    Wearing Rx      Sphere Cylinder Axis Add   Right -9.00 Sphere  +2.25   Left -7.25 +0.50 076 +2.25   Type: PAL          IMAGING AND PROCEDURES  Imaging and Procedures for @TODAY @  OCT, Retina - OU - Both Eyes       Right Eye Quality was good. Central Foveal Thickness: 231. Progression has been stable. Findings include normal foveal contour, no IRF, no SRF, myopic contour.   Left Eye Quality was good. Central Foveal Thickness: 235. Progression has been stable. Findings include normal foveal contour, no IRF, no SRF, myopic contour, vitreomacular adhesion .   Notes *Images captured and stored on drive  Diagnosis / Impression:  NFP, no IRF/SRF OU Myopic contour OU  Clinical management:  See below  Abbreviations: NFP - Normal foveal profile. CME - cystoid macular edema. PED -  pigment epithelial detachment. IRF - intraretinal fluid. SRF - subretinal fluid. EZ - ellipsoid zone. ERM - epiretinal membrane. ORA - outer retinal atrophy. ORT - outer retinal tubulation. SRHM - subretinal hyper-reflective material  ASSESSMENT/PLAN:    ICD-10-CM   1. Retinal tear of right eye  H33.311   2. Right retinal detachment  H33.21   3. Retinal edema  H35.81 OCT, Retina - OU - Both Eyes  4. Lattice degeneration of left retina  H35.412   5. Retinal hole of left eye  H33.322   6. Posterior vitreous detachment of right eye  H43.811   7. Essential hypertension  I10   8. Hypertensive retinopathy of both eyes  H35.033   9. Combined forms of age-related cataract of both eyes  H25.813    1-3. Horseshoe tear w/ cuff of SRF / focal RD, OD  - HST located at 1230 with cuff of SRF  - s/p laser retinopexy OD (06.16.21) -- good laser surrounding  - f/u 3 months, DFE, OCT  4,5. Lattice degeneration w/ atrophic holes, left eye - pigmented lattice from 5-7 oclock - s/p laser retinopexy OS (08.09.21) -- good laser surrounding - completed PF QID OS x7 days - f/u in 3 months, sooner prn -- POV  6. PVD / vitreous syneresis OD  - pt reports appearance of floaters back in November 2020 and was seen by Dr. Emily Filbert  - Discussed findings and prognosis  - RT/focal RD identified OD as above  - Reviewed s/s of RT/RD  - Strict return precautions for any such RT/RD signs/symptoms  - monitor  7,8. Hypertensive retinopathy OU  - discussed importance of tight BP control  - monitor  9. Mixed form cataract OU  - under the expert management of Dr. Burgess Estelle  - not yet visually significant     Ophthalmic Meds Ordered this visit:  No orders of the defined types were placed in this encounter.      Return in about 3 months (around 12/05/2019) for f/u retinal tears OU, DFE, OCT.  There are no Patient Instructions on file for this visit.  This document serves as a record of  services personally performed by Karie Chimera, MD, PhD. It was created on their behalf by Herby Abraham, COA, an ophthalmic technician. The creation of this record is the provider's dictation and/or activities during the visit.    Electronically signed by: Herby Abraham, COA 08.26.2021 5:03 PM   This document serves as a record of services personally performed by Karie Chimera, MD, PhD. It was created on their behalf by Glee Arvin. Manson Passey, OA an ophthalmic technician. The creation of this record is the provider's dictation and/or activities during the visit.    Electronically signed by: Glee Arvin. Manson Passey, [place] 08.30.2021 5:03 PM  Karie Chimera, M.D., Ph.D. Diseases & Surgery of the Retina and Vitreous [facility] 09/04/2019   I have reviewed the above documentation for accuracy and completeness, and I agree with the above. Karie Chimera, M.D., Ph.D. 09/04/19 5:03 PM   Abbreviations: M myopia (nearsighted); A astigmatism; H hyperopia (farsighted); P presbyopia; Mrx spectacle prescription;  CTL contact lenses; OD right eye; OS left eye; OU both eyes  XT exotropia; ET esotropia; PEK punctate epithelial keratitis; PEE punctate epithelial erosions; DES dry eye syndrome; MGD meibomian gland dysfunction; ATs artificial tears; PFAT's preservative free artificial tears; NSC nuclear sclerotic cataract; PSC posterior subcapsular cataract; ERM epi-retinal membrane; PVD posterior vitreous detachment; RD retinal detachment; DM diabetes mellitus; DR diabetic retinopathy; NPDR non-proliferative diabetic retinopathy; PDR proliferative diabetic retinopathy; CSME clinically significant macular edema; DME diabetic macular edema; dbh dot blot hemorrhages; CWS cotton wool spot; POAG primary open angle glaucoma;  C/D cup-to-disc ratio; HVF humphrey visual field; GVF goldmann visual field; OCT optical coherence tomography; IOP intraocular pressure; BRVO Branch retinal vein occlusion; CRVO central  retinal vein occlusion; CRAO central retinal artery occlusion; BRAO branch retinal artery occlusion; RT retinal tear; SB scleral buckle; PPV pars plana vitrectomy; VH Vitreous hemorrhage; PRP panretinal laser photocoagulation; IVK intravitreal kenalog; VMT vitreomacular traction; MH Macular hole;  NVD neovascularization of the disc; NVE neovascularization elsewhere; AREDS age related eye disease study; ARMD age related macular degeneration; POAG primary open angle glaucoma; EBMD epithelial/anterior basement membrane dystrophy; ACIOL anterior chamber intraocular lens; IOL intraocular lens; PCIOL posterior chamber intraocular lens; Phaco/IOL phacoemulsification with intraocular lens placement; PRK photorefractive keratectomy; LASIK laser assisted in situ keratomileusis; HTN hypertension; DM diabetes mellitus; COPD chronic obstructive pulmonary disease

## 2019-08-31 NOTE — Telephone Encounter (Signed)
Pt called stating her trigeminal neuralgia is getting worse and she is wanting to discuss with RN or Provider. Please advise.

## 2019-08-31 NOTE — Telephone Encounter (Signed)
Pt stated with increase of gabapentin 600mg  po tid, has made her more drowsy, tired.

## 2019-08-31 NOTE — Telephone Encounter (Signed)
I called pt and relayed that trileptal to change to 1.5 450mg  tablets po bid watch for SE nausea, headache, fatigue, cramps.  Her level 141 normal.  She asked about B12 and apple cider vinegar using this for inflammation.

## 2019-08-31 NOTE — Telephone Encounter (Signed)
We could increase gabapentin to 600 mg 4 times a day or 800 mg 3 times daily. If gabapentin doesn't seem to do the trick, we could transition to Lyrica in the future. We have room to increase Trileptal if needed, if she feels this is more helpful than gabapentin (trying 300 mg, 1.5 tablets BID)

## 2019-08-31 NOTE — Telephone Encounter (Signed)
We can try increase Trileptal 300 mg tablet, 1.5 tablets twice daily. I am seeing her next month. Sodium level was 141 at last visit, will recheck next visit. Let her know side effect, potential drop sodium levels.

## 2019-08-31 NOTE — Telephone Encounter (Signed)
I called and spoke to pt.  She is no etter, no worse on the sharp shooting TN pain that she has on her L side nose, now moving she states to above eye, top of head.  Taking oxcarbamazepine 300mg  po bid, and gabapentin 600mg  po tid from last speaking to Dr. on the 08-24-19.  She states daily (all thru out day).   Please advise.

## 2019-08-31 NOTE — Addendum Note (Signed)
Addended by: Glean Salvo on: 08/31/2019 02:13 PM   Modules accepted: Orders

## 2019-08-31 NOTE — Telephone Encounter (Signed)
I called pt and I relayed that b12 and apple cider vinegar should not be issue 9in moderation).  She appreciated call back.

## 2019-09-04 ENCOUNTER — Ambulatory Visit (INDEPENDENT_AMBULATORY_CARE_PROVIDER_SITE_OTHER): Payer: BC Managed Care – PPO | Admitting: Ophthalmology

## 2019-09-04 ENCOUNTER — Encounter (INDEPENDENT_AMBULATORY_CARE_PROVIDER_SITE_OTHER): Payer: Self-pay | Admitting: Ophthalmology

## 2019-09-04 ENCOUNTER — Other Ambulatory Visit: Payer: Self-pay

## 2019-09-04 DIAGNOSIS — H33322 Round hole, left eye: Secondary | ICD-10-CM

## 2019-09-04 DIAGNOSIS — H25813 Combined forms of age-related cataract, bilateral: Secondary | ICD-10-CM

## 2019-09-04 DIAGNOSIS — I10 Essential (primary) hypertension: Secondary | ICD-10-CM

## 2019-09-04 DIAGNOSIS — H3321 Serous retinal detachment, right eye: Secondary | ICD-10-CM

## 2019-09-04 DIAGNOSIS — H3581 Retinal edema: Secondary | ICD-10-CM

## 2019-09-04 DIAGNOSIS — H33311 Horseshoe tear of retina without detachment, right eye: Secondary | ICD-10-CM

## 2019-09-04 DIAGNOSIS — H35033 Hypertensive retinopathy, bilateral: Secondary | ICD-10-CM

## 2019-09-04 DIAGNOSIS — H35412 Lattice degeneration of retina, left eye: Secondary | ICD-10-CM

## 2019-09-04 DIAGNOSIS — H43811 Vitreous degeneration, right eye: Secondary | ICD-10-CM

## 2019-09-05 ENCOUNTER — Ambulatory Visit (INDEPENDENT_AMBULATORY_CARE_PROVIDER_SITE_OTHER): Payer: BC Managed Care – PPO | Admitting: Otolaryngology

## 2019-09-05 VITALS — Temp 97.3°F

## 2019-09-05 DIAGNOSIS — J342 Deviated nasal septum: Secondary | ICD-10-CM | POA: Diagnosis not present

## 2019-09-05 DIAGNOSIS — G500 Trigeminal neuralgia: Secondary | ICD-10-CM | POA: Diagnosis not present

## 2019-09-05 NOTE — Progress Notes (Signed)
HPI: Heather Jenkins is a 64 y.o. female who presents for evaluation of chronic left-sided facial pains.  She also feels like she has more swelling on the left side of her face compared to the right.  She has been diagnosed with trigeminal neuralgia by neurology and had an MRI scan performed 3 months ago.  She had a previous CT scan of her sinuses performed in February of last year and I reviewed this.  This showed clear paranasal sinuses except for small thickening along the floor the left maxillary sinus.  However the [facility] on the left side was patent and nonobstructed.  On review of the recent MRI scan this showed clear paranasal sinuses although again noted is some thickening along the floor the left maxillary sinus which apparently has been chronic.  She does have a septal deviation to the left.  She has small frontal sinuses which are nonobstructed. She describes pain discomfort on the left cheek area that goes around her eye up to the forehead.  She has occasional had pain of her scalp.  Past Medical History:  Diagnosis Date  . Chronic maxillary sinusitis   . History of kidney stones   . Hyperlipemia   . Hypertension   . Left facial pain    Past Surgical History:  Procedure Laterality Date  . RECTOCELE REPAIR    . TAH w/ single oophorectomy     Social History   Socioeconomic History  . Marital status: Married    Spouse name: Not on file  . Number of children: 2  . Years of education: college  . Highest education level: Associate degree: academic program  Occupational History  . Occupation: Astronomer  Tobacco Use  . Smoking status: Never Smoker  . Smokeless tobacco: Never Used  Substance and Sexual Activity  . Alcohol use: Yes    Comment: rare  . Drug use: Never  . Sexual activity: Not on file  Other Topics Concern  . Not on file  Social History Narrative   Lives at home with husband.   Right-handed.   Caffeine: occasional use.   Social Determinants of  Health   Financial Resource Strain:   . Difficulty of Paying Living Expenses: Not on file  Food Insecurity:   . Worried About Programme researcher, broadcasting/film/video in the Last Year: Not on file  . Ran Out of Food in the Last Year: Not on file  Transportation Needs:   . Lack of Transportation (Medical): Not on file  . Lack of Transportation (Non-Medical): Not on file  Physical Activity:   . Days of Exercise per Week: Not on file  . Minutes of Exercise per Session: Not on file  Stress:   . Feeling of Stress : Not on file  Social Connections:   . Frequency of Communication with Friends and Family: Not on file  . Frequency of Social Gatherings with Friends and Family: Not on file  . Attends Religious Services: Not on file  . Active Member of Clubs or Organizations: Not on file  . Attends Banker Meetings: Not on file  . Marital Status: Not on file   Family History  Problem Relation Age of Onset  . Ovarian cancer Mother   . Suicidality Father        age 35  . Melanoma Sister   . Breast cancer Neg Hx    Allergies  Allergen Reactions  . Penicillins Hives   Prior to Admission medications   Medication Sig Start  Date End Date Taking? Authorizing Provider  atorvastatin (LIPITOR) 10 MG tablet Take 10 mg by mouth daily. 03/06/19  Yes [provider]  Cholecalciferol (VITAMIN D3 PO) Take 1,000 Units by mouth daily.   Yes [provider]  fexofenadine-pseudoephedrine (ALLEGRA-D 24) 180-240 MG 24 hr tablet Take 1 tablet by mouth daily as needed.   Yes [provider]  gabapentin (NEURONTIN) 300 MG capsule Take 2 capsules (600 mg total) by mouth 3 (three) times daily. 05/02/19  Yes Levert Feinstein, MD  hydrochlorothiazide (HYDRODIURIL) 25 MG tablet Take 25 mg by mouth daily. 02/13/19  Yes [provider]  losartan (COZAAR) 100 MG tablet Take 100 mg by mouth daily. 03/19/19  Yes [provider]  Oxcarbazepine (TRILEPTAL) 300 MG tablet Take 1.5 tablets (450 mg  total) by mouth 2 (two) times daily. 08/31/19  Yes Glean Salvo, NP     Positive ROS: Otherwise negative  All other systems have been reviewed and were otherwise negative with the exception of those mentioned in the HPI and as above.  Physical Exam: Constitutional: Alert, well-appearing, no acute distress Ears: External ears without lesions or tenderness. Ear canals are clear bilaterally with intact, clear TMs.  Nasal: External nose without lesions. Septum is deviated to the left more posteriorly.  Both middle meatus regions are clear with no mucopurulent discharge and no polyps noted.. Clear nasal passages otherwise. Oral: Lips and gums without lesions. Tongue and palate mucosa without lesions. Posterior oropharynx clear. Neck: No palpable adenopathy or masses Respiratory: Breathing comfortably  Skin: No facial/neck lesions or rash noted.  She has no significant swelling or erythema of the facial skin.  Procedures  Assessment: Reviewed the CT scan and MRI scan with the patient in the office today.  There is no clinical evidence of acute sinus infection.  She does have a septal deviation to the left with mild rhinitis and might benefit from use of nasal steroid spray such as Flonase or Nasacort. History of trigeminal neuralgia  Plan: Discussed with her concerning use of Nasacort or Flonase as this will help some with congestion and pressure in the sinuses. When she is having intense pain or pressure could try Afrin to see if this relieves any of her symptoms. Reviewed the scans with her in the office today and there was no obvious evidence of acute sinus problems.   I concur that this is most likely neurologic or consistent with a trigeminal neuralgia.  Narda Bonds, MD

## 2019-10-02 ENCOUNTER — Ambulatory Visit: Payer: BC Managed Care – PPO | Admitting: Neurology

## 2019-10-02 NOTE — Progress Notes (Deleted)
PATIENT: Heather Jenkins DOB: 11-Mar-1955  REASON FOR VISIT: follow up HISTORY FROM: patient  HISTORY OF PRESENT ILLNESS: Today 10/02/19  HISTORY Heather Jenkins Moyeris a 64 year old female,seen in request by her primary care physician Dr.Wile, Mickel Baas H,for evaluation of intermittent left facial pain, she is accompanied by her husband at today's clinical visit on May 02, 2019.  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  She reported intermittent left facial pain, shooting from left cheek to left nose since September 2019, initial episode was short lasting, intermittent, she was treated as sinus infection  She had recurrent episode again in June 2020, lasting for 3 months, no diagnosis was made  She had most severe episode since February 2021, still ongoing, she reported the pain is present three-quarter of the time, sharp severe radiating pain from left cheek to left inner eye corner, worsening by teeth brushing, chewing, talking, touching her face,  CT sinus from [facility] on April 25, 2019, amount of mucosal thickening at the left greater than right inferior maxillary sinus, minimum LOCATION of the right ethmoid air cells,  Was treated with titrating dose of gabapentin which has helped her symptoms some, now taking 300 mg 2 tablets 3 times a day, complains of drowsiness, despite that, she has been taking tramadol 50 mg 2 tablets 3 times a day, the combination helped her pain better, but she complains of increased side effect  She denies rash broke out, denies visual loss, hearing loss,  Update May 29, 2019 SS: Laboratory evaluation showed no significant abnormality, slight elevated CRP 12, normal ESR.  MRI of the brain in May 2021 showed no significant abnormality.  When last seen, Trileptal was added. Doing  really well currently, is off tramadol, taking oxcarbazepine 300 mg twice daily, gabapentin 600 mg 3 times daily. Her mother passed away recently. Has not had any pain recently.  Update October 02, 2019 SS:     REVIEW OF SYSTEMS: Out of a complete 14 system review of symptoms, the patient complains only of the following symptoms, and all other reviewed systems are negative.  ALLERGIES: Allergies  Allergen Reactions  . Penicillins Hives    HOME MEDICATIONS: Outpatient Medications Prior to Visit  Medication Sig Dispense Refill  . atorvastatin (LIPITOR) 10 MG tablet Take 10 mg by mouth daily.    . Cholecalciferol (VITAMIN D3 PO) Take 1,000 Units by mouth daily.    . fexofenadine-pseudoephedrine (ALLEGRA-D 24) 180-240 MG 24 hr tablet Take 1 tablet by mouth daily as needed.    . gabapentin (NEURONTIN) 300 MG capsule Take 2 capsules (600 mg total) by mouth 3 (three) times daily. 180 capsule 11  . hydrochlorothiazide (HYDRODIURIL) 25 MG tablet Take 25 mg by mouth daily.    Marland Kitchen losartan (COZAAR) 100 MG tablet Take 100 mg by mouth daily.    . Oxcarbazepine (TRILEPTAL) 300 MG tablet Take 1.5 tablets (450 mg total) by mouth 2 (two) times daily. 90 tablet 11   No facility-administered medications prior to visit.    PAST MEDICAL HISTORY: Past Medical History:  Diagnosis Date  . Chronic maxillary sinusitis   . History of kidney stones   . Hyperlipemia   . Hypertension   . Left facial pain     PAST SURGICAL HISTORY: Past Surgical History:  Procedure Laterality Date  . RECTOCELE REPAIR    . TAH w/  single oophorectomy      FAMILY HISTORY: Family History  Problem Relation Age of Onset  . Ovarian cancer Mother   . Suicidality Father        age 42  . Melanoma Sister   . Breast cancer Neg Hx     SOCIAL HISTORY: Social History   Socioeconomic History  . Marital status: Married    Spouse name: Not on file  . Number of children: 2  . Years of education: college  . Highest  education level: Associate degree: academic program  Occupational History  . Occupation: Surveyor, quantity  Tobacco Use  . Smoking status: Never Smoker  . Smokeless tobacco: Never Used  Substance and Sexual Activity  . Alcohol use: Yes    Comment: rare  . Drug use: Never  . Sexual activity: Not on file  Other Topics Concern  . Not on file  Social History Narrative   Lives at home with husband.   Right-handed.   Caffeine: occasional use.   Social Determinants of Health   Financial Resource Strain:   . Difficulty of Paying Living Expenses: Not on file  Food Insecurity:   . Worried About Charity fundraiser in the Last Year: Not on file  . Ran Out of Food in the Last Year: Not on file  Transportation Needs:   . Lack of Transportation (Medical): Not on file  . Lack of Transportation (Non-Medical): Not on file  Physical Activity:   . Days of Exercise per Week: Not on file  . Minutes of Exercise per Session: Not on file  Stress:   . Feeling of Stress : Not on file  Social Connections:   . Frequency of Communication with Friends and Family: Not on file  . Frequency of Social Gatherings with Friends and Family: Not on file  . Attends Religious Services: Not on file  . Active Member of Clubs or Organizations: Not on file  . Attends Archivist Meetings: Not on file  . Marital Status: Not on file  Intimate Partner Violence:   . Fear of Current or Ex-Partner: Not on file  . Emotionally Abused: Not on file  . Physically Abused: Not on file  . Sexually Abused: Not on file      PHYSICAL EXAM  There were no vitals filed for this visit. There is no height or weight on file to calculate BMI.  Generalized: Well developed, in no acute distress   Neurological examination  Mentation: Alert oriented to time, place, history taking. Follows all commands speech and language fluent Cranial nerve II-XII: Pupils were equal round reactive to light. Extraocular movements were  full, visual field were full on confrontational test. Facial sensation and strength were normal. Uvula tongue midline. Head turning and shoulder shrug  were normal and symmetric. Motor: The motor testing reveals 5 over 5 strength of all 4 extremities. Good symmetric motor tone is noted throughout.  Sensory: Sensory testing is intact to soft touch on all 4 extremities. No evidence of extinction is noted.  Coordination: Cerebellar testing reveals good finger-nose-finger and heel-to-shin bilaterally.  Gait and station: Gait is normal. Tandem gait is normal. Romberg is negative. No drift is seen.  Reflexes: Deep tendon reflexes are symmetric and normal bilaterally.   DIAGNOSTIC DATA (LABS, IMAGING, TESTING) - I reviewed patient records, labs, notes, testing and imaging myself where available.  Lab Results  Component Value Date   WBC 6.9 05/02/2019   HGB 13.3 05/02/2019   HCT 39.7 05/02/2019  MCV 88 05/02/2019      Component Value Date/Time   NA 141 05/02/2019 1557   K 3.7 05/02/2019 1557   CL 99 05/02/2019 1557   CO2 29 05/02/2019 1557   GLUCOSE 134 (H) 05/02/2019 1557   BUN 11 05/02/2019 1557   CREATININE 0.73 05/02/2019 1557   CALCIUM 10.0 05/02/2019 1557   PROT 6.8 05/02/2019 1557   ALBUMIN 4.5 05/02/2019 1557   AST 20 05/02/2019 1557   ALT 34 (H) 05/02/2019 1557   ALKPHOS 64 05/02/2019 1557   BILITOT 0.6 05/02/2019 1557   GFRNONAA 89 05/02/2019 1557   GFRAA 102 05/02/2019 1557   No results found for: CHOL, HDL, LDLCALC, LDLDIRECT, TRIG, CHOLHDL No results found for: HGBA1C No results found for: VITAMINB12 Lab Results  Component Value Date   TSH 1.330 05/02/2019      ASSESSMENT AND PLAN 64 y.o. year old female  has a past medical history of Chronic maxillary sinusitis, History of kidney stones, Hyperlipemia, Hypertension, and Left facial pain. here with:  1.  Left trigeminal neuralgia -MRI of the brain with and without contrast in May 2021 did not show significant  abnormality -TSH, ESR, CBC were normal, CRP mildly elevated 12, glucose 134   I spent 15 minutes with the patient. 50% of this time was spent   Butler Denmark, [place], [place] 10/02/2019, 6:21 AM [facility] [address] [place], [place] [address] [phone]

## 2019-10-04 ENCOUNTER — Ambulatory Visit: Payer: BC Managed Care – PPO | Admitting: Neurology

## 2019-10-04 ENCOUNTER — Encounter: Payer: Self-pay | Admitting: Neurology

## 2019-10-04 VITALS — BP 145/78 | HR 100 | Ht 64.0 in | Wt 203.6 lb

## 2019-10-04 DIAGNOSIS — R42 Dizziness and giddiness: Secondary | ICD-10-CM

## 2019-10-04 DIAGNOSIS — G500 Trigeminal neuralgia: Secondary | ICD-10-CM | POA: Diagnosis not present

## 2019-10-04 MED ORDER — GABAPENTIN 300 MG PO CAPS: 600.0000 mg | ORAL_CAPSULE | Freq: Three times a day (TID) | ORAL | 4 refills | Status: DC

## 2019-10-04 MED ORDER — OXCARBAZEPINE 300 MG PO TABS: 450.0000 mg | ORAL_TABLET | Freq: Two times a day (BID) | ORAL | 4 refills | Status: DC

## 2019-10-04 NOTE — Progress Notes (Signed)
HISTORY OF PRESENT ILLNESS: Heather Jenkins is a 64 year old female, seen in request by her primary care physician Dr. Nadene Rubins, [name], for evaluation of intermittent left facial pain, she is accompanied by her husband at today's clinical visit on May 02, 2019.  I have reviewed and summarized the referring note from the referring physician.  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  She reported intermittent left facial pain, shooting from left cheek to left nose since September 2019, initial episode was short lasting, intermittent, she was treated as sinus infection  She had recurrent episode again in June 2020, lasting for 3 months, no diagnosis was made  She had most severe episode since February 2021, still ongoing, she reported the pain is present three-quarter of the time, sharp severe radiating pain from left cheek to left inner eye corner, worsening by teeth brushing, chewing, talking, touching her face,  CT sinus from [facility] on April 25, 2019, amount of mucosal thickening at the left greater than right inferior maxillary sinus, minimum LOCATION of the right ethmoid air cells,  Was treated with titrating dose of gabapentin which has helped her symptoms some, now taking 300 mg 2 tablets 3 times a day, complains of drowsiness, despite that, she has been taking tramadol 50 mg 2 tablets 3 times a day, the combination helped her pain better, but she complains of increased side effect  She denies rash broke out, denies visual loss, hearing loss,  UPDATE Sept 29 2021: Her left facial pain for a while was under reasonable control with gabapentin 300 mg 2 tablets 3 times a day, Trileptal 300 mg 1 and half tablet twice a day, in June 2021, she attempted to decrease the medications, that had flareup of her trigeminal pain, call the office August 26, 20 21, Trileptal was increased from 300 twice daily to 450  twice daily, which has helped her symptoms  She complains of some side effect from the medications, dizziness, lightheadedness, unsteadiness   REVIEW OF SYSTEMS: Out of a complete 14 system review of symptoms, the patient complains only of the following symptoms, and all other reviewed systems are negative.  Facial pain  ALLERGIES: Allergies  Allergen Reactions  . Penicillins Hives    HOME MEDICATIONS: Outpatient Medications Prior to Visit  Medication Sig Dispense Refill  . ASPIRIN 81 PO Take 1 tablet by mouth daily.    Marland Kitchen atorvastatin (LIPITOR) 10 MG tablet Take 10 mg by mouth daily.    . Cholecalciferol (VITAMIN D3 PO) Take 1,000 Units by mouth daily.    . cyanocobalamin 1000 MCG tablet Take 1,000 mcg by mouth daily.    . fexofenadine-pseudoephedrine (ALLEGRA-D 24) 180-240 MG 24 hr tablet Take 1 tablet by mouth daily as needed.    . gabapentin (NEURONTIN) 300 MG capsule Take 2 capsules (600 mg total) by mouth 3 (three) times daily. 180 capsule 11  . hydrochlorothiazide (HYDRODIURIL) 25 MG tablet Take 25 mg by mouth daily.    Marland Kitchen losartan (COZAAR) 100 MG tablet Take 100 mg by mouth daily.    . Oxcarbazepine (TRILEPTAL) 300 MG tablet Take 1.5 tablets (450 mg total) by mouth 2 (two) times daily. 90 tablet 11   No facility-administered medications prior to visit.    PAST MEDICAL HISTORY: Past Medical History:  Diagnosis Date  . Chronic maxillary sinusitis   . History of kidney stones   . Hyperlipemia   . Hypertension   .  Left facial pain     PAST SURGICAL HISTORY: Past Surgical History:  Procedure Laterality Date  . RECTOCELE REPAIR    . TAH w/ single oophorectomy      FAMILY HISTORY: Family History  Problem Relation Age of Onset  . Ovarian cancer Mother   . Suicidality Father        age 38  . Melanoma Sister   . Breast cancer Neg Hx     SOCIAL HISTORY: Social History   Socioeconomic History  . Marital status: Married    Spouse name: Not on file  . Number of  children: 2  . Years of education: college  . Highest education level: Associate degree: academic program  Occupational History  . Occupation: Astronomer  Tobacco Use  . Smoking status: Never Smoker  . Smokeless tobacco: Never Used  Substance and Sexual Activity  . Alcohol use: Yes    Comment: rare  . Drug use: Never  . Sexual activity: Not on file  Other Topics Concern  . Not on file  Social History Narrative   Lives at home with husband.   Right-handed.   Caffeine: occasional use.   Social Determinants of Health   Financial Resource Strain:   . Difficulty of Paying Living Expenses: Not on file  Food Insecurity:   . Worried About Programme researcher, broadcasting/film/video in the Last Year: Not on file  . Ran Out of Food in the Last Year: Not on file  Transportation Needs:   . Lack of Transportation (Medical): Not on file  . Lack of Transportation (Non-Medical): Not on file  Physical Activity:   . Days of Exercise per Week: Not on file  . Minutes of Exercise per Session: Not on file  Stress:   . Feeling of Stress : Not on file  Social Connections:   . Frequency of Communication with Friends and Family: Not on file  . Frequency of Social Gatherings with Friends and Family: Not on file  . Attends Religious Services: Not on file  . Active Member of Clubs or Organizations: Not on file  . Attends Banker Meetings: Not on file  . Marital Status: Not on file  Intimate Partner Violence:   . Fear of Current or Ex-Partner: Not on file  . Emotionally Abused: Not on file  . Physically Abused: Not on file  . Sexually Abused: Not on file   PHYSICAL EXAM  Vitals:   10/04/19 1154  BP: (!) 145/78  Pulse: 100  SpO2: 98%  Weight: 203 lb 9.6 oz (92.4 kg)  Height: 5\' 4"  (1.626 m)   Body mass index is 34.95 kg/m. She has frequent right orbicularis oculi muscle spasm, right cheek muscle twitching, right mouth corner muscle twitching,  DIAGNOSTIC DATA (LABS, IMAGING, TESTING) - I  reviewed patient records, labs, notes, testing and imaging myself where available.  Lab Results  Component Value Date   WBC 6.9 05/02/2019   HGB 13.3 05/02/2019   HCT 39.7 05/02/2019   MCV 88 05/02/2019      Component Value Date/Time   NA 141 05/02/2019 1557   K 3.7 05/02/2019 1557   CL 99 05/02/2019 1557   CO2 29 05/02/2019 1557   GLUCOSE 134 (H) 05/02/2019 1557   BUN 11 05/02/2019 1557   CREATININE 0.73 05/02/2019 1557   CALCIUM 10.0 05/02/2019 1557   PROT 6.8 05/02/2019 1557   ALBUMIN 4.5 05/02/2019 1557   AST 20 05/02/2019 1557   ALT 34 (H) 05/02/2019 1557  ALKPHOS 64 05/02/2019 1557   BILITOT 0.6 05/02/2019 1557   GFRNONAA 89 05/02/2019 1557   GFRAA 102 05/02/2019 1557   No results found for: CHOL, HDL, LDLCALC, LDLDIRECT, TRIG, CHOLHDL No results found for: ZOXW9U No results found for: VITAMINB12 Lab Results  Component Value Date   TSH 1.330 05/02/2019      ASSESSMENT AND PLAN 64 y.o. year old female Left trigeminal neuralgia involving left V2 branch, -MRI of the brain with and without contrast in May 2021 did not show significant abnormality Recurrent left facial pain in attempt to decrease the gabapentin, now pain is under better control with current medications  Gabapentin 300 mg 2 tablets 3 times a day  Trileptal 300 mg 1 and half tablets twice a day,   Laboratory evaluations to rule out hyponatremia, also check Trileptal level is baseline  Clinic for recurrent symptoms, if her facial pain is under good control for a while, may consider back of on Trileptal first, slow tapering,  Levert Feinstein, M.D. Ph.D.  [facility] [address] [place], [place] [address] Phone: [phone] Fax:      [phone]

## 2019-10-07 LAB — COMPREHENSIVE METABOLIC PANEL
ALT: 21 IU/L (ref 0–32)
AST: 19 IU/L (ref 0–40)
Albumin/Globulin Ratio: 2 (ref 1.2–2.2)
Albumin: 4.7 g/dL (ref 3.8–4.8)
Alkaline Phosphatase: 71 IU/L (ref 44–121)
BUN/Creatinine Ratio: 16 (ref 12–28)
BUN: 10 mg/dL (ref 8–27)
Bilirubin Total: 0.5 mg/dL (ref 0.0–1.2)
CO2: 24 mmol/L (ref 20–29)
Calcium: 10.3 mg/dL (ref 8.7–10.3)
Chloride: 100 mmol/L (ref 96–106)
Creatinine, Ser: 0.62 mg/dL (ref 0.57–1.00)
GFR calc Af Amer: 112 mL/min/{1.73_m2} (ref >59)
GFR calc non Af Amer: 97 mL/min/{1.73_m2} (ref >59)
Globulin, Total: 2.3 g/dL (ref 1.5–4.5)
Glucose: 104 mg/dL — ABNORMAL HIGH (ref 65–99)
Potassium: 4.4 mmol/L (ref 3.5–5.2)
Sodium: 139 mmol/L (ref 134–144)
Total Protein: 7 g/dL (ref 6.0–8.5)

## 2019-10-07 LAB — 10-HYDROXYCARBAZEPINE: Oxcarbazepine SerPl-Mcnc: 20 ug/mL (ref 10–35)

## 2019-10-09 ENCOUNTER — Telehealth: Payer: Self-pay | Admitting: *Deleted

## 2019-10-09 NOTE — Telephone Encounter (Signed)
Left patient a detailed message, with results, on voicemail.  Provided our number to call back with any questions.  

## 2019-10-09 NOTE — Telephone Encounter (Signed)
-----   Message from Levert Feinstein, MD sent at 10/09/2019  8:39 AM EDT ----- Please call patient for no significant abnormalities on laboratory evaluations.

## 2019-12-02 NOTE — Progress Notes (Signed)
[facility] - Clinic Note  12/05/2019     CHIEF COMPLAINT Patient presents for Retina Follow Up   HISTORY OF PRESENT ILLNESS: Heather Jenkins is a 64 y.o. female who presents to the clinic today for:   HPI    Retina Follow Up    Patient presents with  Retinal Break/Detachment.  In right eye.  Severity is moderate.  Duration of 3 months.  Since onset it is stable.  I, the attending physician,  performed the HPI with the patient and updated documentation appropriately.          Comments    Patient states vision the same OU. Occasional flash and floater OD. No worsening of floaters or flashes.        Last edited by Rennis ChrisZamora, Safiyah Cisney, MD on 12/05/2019 12:59 PM. (History)    pt is delayed to follow up from 3 weeks to 3 months, pt states she sees occasional flashes of light in her right eye, but has been using Systane and feels like that has helped with the symptoms, she also sees a "flutter" inferiorly first thing in the morning  Referring physician: Melida QuitterWile, Laura H, MD [address] [place] [id number]  HISTORICAL INFORMATION:   Selected notes from the MEDICAL RECORD NUMBER Referred by Dr. Burgess Estelleanner for retinal tear OD   CURRENT MEDICATIONS: No current outpatient medications on file. (Ophthalmic Drugs)   No current facility-administered medications for this visit. (Ophthalmic Drugs)   Current Outpatient Medications (Other)  Medication Sig  . atorvastatin (LIPITOR) 10 MG tablet Take 10 mg by mouth daily.  . Cholecalciferol (VITAMIN D3 PO) Take 1,000 Units by mouth daily.  . cyanocobalamin 1000 MCG tablet Take 1,000 mcg by mouth daily.  . fexofenadine (ALLEGRA) 180 MG tablet Take 180 mg by mouth daily.  Marland Kitchen. gabapentin (NEURONTIN) 300 MG capsule Take 2 capsules (600 mg total) by mouth 3 (three) times daily.  . hydrochlorothiazide (HYDRODIURIL) 25 MG tablet Take 25 mg by mouth daily.  Marland Kitchen. losartan (COZAAR) 100 MG tablet Take 100 mg by mouth daily.  .  Oxcarbazepine (TRILEPTAL) 300 MG tablet Take 1.5 tablets (450 mg total) by mouth 2 (two) times daily.  . ASPIRIN 81 PO Take 1 tablet by mouth daily. (Patient not taking: Reported on 12/05/2019)  . fexofenadine-pseudoephedrine (ALLEGRA-D 24) 180-240 MG 24 hr tablet Take 1 tablet by mouth daily as needed. (Patient not taking: Reported on 12/05/2019)   No current facility-administered medications for this visit. (Other)      REVIEW OF SYSTEMS: ROS    Positive for: Eyes   Negative for: Constitutional, Gastrointestinal, Neurological, Skin, Genitourinary, Musculoskeletal, HENT, Endocrine, Cardiovascular, Respiratory, Psychiatric, Allergic/Imm, Heme/Lymph   Last edited by Annalee GentaBarber, Daryl D, COT on 12/05/2019  8:33 AM. (History)       ALLERGIES Allergies  Allergen Reactions  . Penicillins Hives    PAST MEDICAL HISTORY Past Medical History:  Diagnosis Date  . Chronic maxillary sinusitis   . History of kidney stones   . Hyperlipemia   . Hypertension   . Left facial pain    Past Surgical History:  Procedure Laterality Date  . RECTOCELE REPAIR    . TAH w/ single oophorectomy      FAMILY HISTORY Family History  Problem Relation Age of Onset  . Ovarian cancer Mother   . Suicidality Father        age 63  . Melanoma Sister   . Breast cancer Neg Hx  SOCIAL HISTORY Social History   Tobacco Use  . Smoking status: Never Smoker  . Smokeless tobacco: Never Used  Substance Use Topics  . Alcohol use: Yes    Comment: rare  . Drug use: Never         OPHTHALMIC EXAM:  Base Eye Exam    Visual Acuity (Snellen - Linear)      Right Left   Dist cc 20/25 +2 20/30 -1   Dist ph cc NI 20/25 +2   Correction: Glasses       Tonometry (Tonopen, 8:41 AM)      Right Left   Pressure 19 18       Pupils      Dark Light Shape React APD   Right 4 3 Round Brisk None   Left 4 3 Round Brisk None       Visual Fields (Counting fingers)      Left Right    Full Full        Extraocular Movement      Right Left    Full, Ortho Full, Ortho       Neuro/Psych    Oriented x3: Yes   Mood/Affect: Normal       Dilation    Both eyes: 1.0% Mydriacyl, 2.5% Phenylephrine @ 8:41 AM        Slit Lamp and Fundus Exam    Slit Lamp Exam      Right Left   Lids/Lashes Dermatochalasis - upper lid Dermatochalasis - upper lid   Conjunctiva/Sclera White and quiet White and quiet   Cornea Trace Punctate epithelial erosions Trace Punctate epithelial erosions   Anterior Chamber Deep and quiet Deep and quiet   Iris Round and dilated Round and dilated   Lens 2+ Nuclear sclerosis, 2+ Cortical cataract 2+ Nuclear sclerosis, 2+ Cortical cataract   Vitreous Vitreous syneresis, no pigment, Posterior vitreous detachment, Weiss ring, vitreous condensations Vitreous syneresis, no pigment       Fundus Exam      Right Left   Disc Pink and Sharp, temporal PPA, tilted, Compact Pink and Sharp, temporal PPA   C/D Ratio 0.4 0.4   Macula Flat, Blunted foveal reflex, mild RPE mottling and clumping, No heme or edema Flat, good foveal reflex, mild RPE mottling and clumping, No heme or edema   Vessels Vascular attenuation, mild tortuousity Vascular attenuation, mild tortuousity, mild AV crossing changes   Periphery Attached, pigmented paving stone degeneration inferiorly at 0600, HST at 1230 with small cuff of SRF -- good laser surrounding, No new RT/RD Attached, focal pigment VR tuft at 0430 equator, pigmented lattice from 5-7 oclock, -- good laser surrounding all lesions, mild pigmented paving stone degeneration inferior; no new RT/RD        Refraction    Wearing Rx      Sphere Cylinder Axis Add   Right -9.00 Sphere  +2.25   Left -7.25 +0.50 076 +2.25   Type: PAL          IMAGING AND PROCEDURES  Imaging and Procedures for @TODAY @  OCT, Retina - OU - Both Eyes       Right Eye Quality was good. Central Foveal Thickness: 225. Progression has been stable. Findings include normal  foveal contour, no IRF, no SRF, myopic contour.   Left Eye Quality was good. Central Foveal Thickness: 233. Progression has been stable. Findings include normal foveal contour, no IRF, no SRF, myopic contour, vitreomacular adhesion .   Notes *Images captured and stored on  drive  Diagnosis / Impression:  NFP, no IRF/SRF OU Myopic contour OU  Clinical management:  See below  Abbreviations: NFP - Normal foveal profile. CME - cystoid macular edema. PED - pigment epithelial detachment. IRF - intraretinal fluid. SRF - subretinal fluid. EZ - ellipsoid zone. ERM - epiretinal membrane. ORA - outer retinal atrophy. ORT - outer retinal tubulation. SRHM - subretinal hyper-reflective material                 ASSESSMENT/PLAN:    ICD-10-CM   1. Retinal tear of right eye  H33.311   2. Right retinal detachment  H33.21   3. Retinal edema  H35.81 OCT, Retina - OU - Both Eyes  4. Lattice degeneration of left retina  H35.412   5. Retinal hole of left eye  H33.322   6. Posterior vitreous detachment of right eye  H43.811   7. Essential hypertension  I10   8. Hypertensive retinopathy of both eyes  H35.033   9. Combined forms of age-related cataract of both eyes  H25.813    1-3. Horseshoe tear w/ cuff of SRF / focal RD, OD  - HST located at 1230 with cuff of SRF  - s/p laser retinopexy OD (06.16.21) -- good laser surrounding  - f/u 1 year, DFE, OCT  4,5. Lattice degeneration w/ atrophic holes, left eye - pigmented lattice from 5-7 oclock - s/p laser retinopexy OS (08.09.21) -- good laser surrounding - f/u 1 year, DFE/OCT  6. PVD / vitreous syneresis OD  - pt reports appearance of floaters back in November 2020 and was seen by Dr. Emily Filbert  - Discussed findings and prognosis  - RT/focal RD identified OD as above  - Reviewed s/s of RT/RD  - Strict return precautions for any such RT/RD signs/symptoms  - monitor  7,8. Hypertensive retinopathy OU  - discussed importance of tight BP  control  - monitor  9. Mixed form cataract OU  - under the expert management of Dr. Burgess Estelle  - not yet visually significant    Ophthalmic Meds Ordered this visit:  No orders of the defined types were placed in this encounter.      Return in about 1 year (around 12/04/2020) for f/u retinal tears OU, DFE, OCT.  There are no Patient Instructions on file for this visit.   Explained the diagnoses, plan, and follow up with the patient and they expressed understanding.  Patient expressed understanding of the importance of proper follow up care.    This document serves as a record of services personally performed by Karie Chimera, MD, PhD. It was created on their behalf by Annalee Genta, COMT. The creation of this record is the provider's dictation and/or activities during the visit.  Electronically signed by: Annalee Genta, COMT 12/05/19 1:05 PM   This document serves as a record of services personally performed by Karie Chimera, MD, PhD. It was created on their behalf by Glee Arvin. Manson Passey, OA an ophthalmic technician. The creation of this record is the provider's dictation and/or activities during the visit.    Electronically signed by: Glee Arvin. [place], [place] 11.30.2021 1:05 PM  Karie Chimera, M.D., Ph.D. Diseases & Surgery of the Retina and Vitreous [facility]  I have reviewed the above documentation for accuracy and completeness, and I agree with the above. Karie Chimera, M.D., Ph.D. 12/05/19 1:05 PM   Abbreviations: M myopia (nearsighted); A astigmatism; H hyperopia (farsighted); P presbyopia; Mrx spectacle prescription;  CTL  contact lenses; OD right eye; OS left eye; OU both eyes  XT exotropia; ET esotropia; PEK punctate epithelial keratitis; PEE punctate epithelial erosions; DES dry eye syndrome; MGD meibomian gland dysfunction; ATs artificial tears; PFAT's preservative free artificial tears; NSC nuclear sclerotic cataract; PSC posterior subcapsular  cataract; ERM epi-retinal membrane; PVD posterior vitreous detachment; RD retinal detachment; DM diabetes mellitus; DR diabetic retinopathy; NPDR non-proliferative diabetic retinopathy; PDR proliferative diabetic retinopathy; CSME clinically significant macular edema; DME diabetic macular edema; dbh dot blot hemorrhages; CWS cotton wool spot; POAG primary open angle glaucoma; C/D cup-to-disc ratio; HVF humphrey visual field; GVF goldmann visual field; OCT optical coherence tomography; IOP intraocular pressure; BRVO Branch retinal vein occlusion; CRVO central retinal vein occlusion; CRAO central retinal artery occlusion; BRAO branch retinal artery occlusion; RT retinal tear; SB scleral buckle; PPV pars plana vitrectomy; VH Vitreous hemorrhage; PRP panretinal laser photocoagulation; IVK intravitreal kenalog; VMT vitreomacular traction; MH Macular hole;  NVD neovascularization of the disc; NVE neovascularization elsewhere; AREDS age related eye disease study; ARMD age related macular degeneration; POAG primary open angle glaucoma; EBMD epithelial/anterior basement membrane dystrophy; ACIOL anterior chamber intraocular lens; IOL intraocular lens; PCIOL posterior chamber intraocular lens; Phaco/IOL phacoemulsification with intraocular lens placement; PRK photorefractive keratectomy; LASIK laser assisted in situ keratomileusis; HTN hypertension; DM diabetes mellitus; COPD chronic obstructive pulmonary disease

## 2019-12-05 ENCOUNTER — Other Ambulatory Visit: Payer: Self-pay

## 2019-12-05 ENCOUNTER — Ambulatory Visit (INDEPENDENT_AMBULATORY_CARE_PROVIDER_SITE_OTHER): Payer: BC Managed Care – PPO | Admitting: Ophthalmology

## 2019-12-05 ENCOUNTER — Encounter (INDEPENDENT_AMBULATORY_CARE_PROVIDER_SITE_OTHER): Payer: Self-pay | Admitting: Ophthalmology

## 2019-12-05 DIAGNOSIS — H3321 Serous retinal detachment, right eye: Secondary | ICD-10-CM | POA: Diagnosis not present

## 2019-12-05 DIAGNOSIS — H33311 Horseshoe tear of retina without detachment, right eye: Secondary | ICD-10-CM | POA: Diagnosis not present

## 2019-12-05 DIAGNOSIS — H3581 Retinal edema: Secondary | ICD-10-CM | POA: Diagnosis not present

## 2019-12-05 DIAGNOSIS — H35412 Lattice degeneration of retina, left eye: Secondary | ICD-10-CM | POA: Diagnosis not present

## 2019-12-05 DIAGNOSIS — H35033 Hypertensive retinopathy, bilateral: Secondary | ICD-10-CM

## 2019-12-05 DIAGNOSIS — H25813 Combined forms of age-related cataract, bilateral: Secondary | ICD-10-CM

## 2019-12-05 DIAGNOSIS — I10 Essential (primary) hypertension: Secondary | ICD-10-CM

## 2019-12-05 DIAGNOSIS — H33322 Round hole, left eye: Secondary | ICD-10-CM

## 2019-12-05 DIAGNOSIS — H43811 Vitreous degeneration, right eye: Secondary | ICD-10-CM

## 2019-12-12 DIAGNOSIS — E785 Hyperlipidemia, unspecified: Secondary | ICD-10-CM | POA: Diagnosis not present

## 2019-12-12 DIAGNOSIS — R739 Hyperglycemia, unspecified: Secondary | ICD-10-CM | POA: Diagnosis not present

## 2019-12-19 DIAGNOSIS — G500 Trigeminal neuralgia: Secondary | ICD-10-CM | POA: Diagnosis not present

## 2019-12-19 DIAGNOSIS — I10 Essential (primary) hypertension: Secondary | ICD-10-CM | POA: Diagnosis not present

## 2019-12-27 ENCOUNTER — Telehealth: Payer: Self-pay | Admitting: Neurology

## 2019-12-27 NOTE — Telephone Encounter (Signed)
She started having an increase in pain over the last week. Intermittent, sharp pain especially when touching her head.   She has restarted taking gabapentin 300mg , 2 tablets TID and continued oxcarbazepine 300mg , 1.5 tablet BID.  She would like to know if other medication adjustments can be made to calm her symptoms.

## 2019-12-27 NOTE — Telephone Encounter (Signed)
Pt called, started a week ago, trigeminal neuralgia flare up, left side of my scalp, pain shooting through the side of my nose.  What do I need to do. Do I need to increase my medication. Would like a call from the nurse.

## 2019-12-27 NOTE — Telephone Encounter (Signed)
Per patient, she bumped her left frontotemporal region to a rafter a week ago, since then, she noticed deep achy pain shooting from the left frontal region to left nose, opposite direction to previous left facial pain, felt similar milder pain,  She increased her gabapentin to 300 mg 2 tablets 3 times a day, Trileptal 300 Mg One and Half Twice a Day  I have suggested her if her pain is under good control, may remain at current dose, If not, may consider increased Trileptal to 300 mg 2 tablets twice a day Consider adding on Aleve alternating with Tylenol for pain,

## 2020-02-22 DIAGNOSIS — R000 Tachycardia, unspecified: Secondary | ICD-10-CM | POA: Diagnosis not present

## 2020-02-22 DIAGNOSIS — E785 Hyperlipidemia, unspecified: Secondary | ICD-10-CM | POA: Diagnosis not present

## 2020-02-22 DIAGNOSIS — I10 Essential (primary) hypertension: Secondary | ICD-10-CM | POA: Diagnosis not present

## 2020-03-07 DIAGNOSIS — I10 Essential (primary) hypertension: Secondary | ICD-10-CM | POA: Diagnosis not present

## 2020-03-12 ENCOUNTER — Encounter: Payer: Self-pay | Admitting: *Deleted

## 2020-03-12 ENCOUNTER — Other Ambulatory Visit: Payer: Self-pay | Admitting: *Deleted

## 2020-03-12 DIAGNOSIS — R000 Tachycardia, unspecified: Secondary | ICD-10-CM

## 2020-03-12 NOTE — Progress Notes (Signed)
Patient ID: Heather Jenkins, female   DOB: [birth year], 65 y.o.   MRN: [id number] Patient enrolled for [employer] to ship a 14 day cardiac event monitor to her home. Letter with instructions mailed to patient.

## 2020-03-16 ENCOUNTER — Ambulatory Visit (INDEPENDENT_AMBULATORY_CARE_PROVIDER_SITE_OTHER): Payer: BC Managed Care – PPO

## 2020-03-16 DIAGNOSIS — R000 Tachycardia, unspecified: Secondary | ICD-10-CM | POA: Diagnosis not present

## 2020-04-04 ENCOUNTER — Ambulatory Visit: Payer: BC Managed Care – PPO | Admitting: Neurology

## 2020-04-04 ENCOUNTER — Encounter: Payer: Self-pay | Admitting: Neurology

## 2020-04-04 VITALS — BP 138/83 | HR 85 | Ht 64.0 in | Wt 200.0 lb

## 2020-04-04 DIAGNOSIS — G500 Trigeminal neuralgia: Secondary | ICD-10-CM | POA: Diagnosis not present

## 2020-04-04 MED ORDER — GABAPENTIN 300 MG PO CAPS: 600.0000 mg | ORAL_CAPSULE | Freq: Three times a day (TID) | ORAL | 4 refills | Status: DC

## 2020-04-04 MED ORDER — OXCARBAZEPINE 300 MG PO TABS: 450.0000 mg | ORAL_TABLET | Freq: Two times a day (BID) | ORAL | 4 refills | Status: DC

## 2020-04-04 NOTE — Progress Notes (Signed)
HISTORY OF PRESENT ILLNESS: Heather Jenkins is a 65 year old female, seen in request by her primary care physician Dr. Nadene Rubins, [name], for evaluation of intermittent left facial pain, she is accompanied by her husband at today's clinical visit on May 02, 2019.  I have reviewed and summarized the referring note from the referring physician.  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  She reported intermittent left facial pain, shooting from left cheek to left nose since September 2019, initial episode was short lasting, intermittent, she was treated as sinus infection  She had recurrent episode again in June 2020, lasting for 3 months, no diagnosis was made  She had most severe episode since February 2021, still ongoing, she reported the pain is present three-quarter of the time, sharp severe radiating pain from left cheek to left inner eye corner, worsening by teeth brushing, chewing, talking, touching her face,  CT sinus from [facility] on April 25, 2019, amount of mucosal thickening at the left greater than right inferior maxillary sinus, minimum LOCATION of the right ethmoid air cells,  Was treated with titrating dose of gabapentin which has helped her symptoms some, now taking 300 mg 2 tablets 3 times a day, complains of drowsiness, despite that, she has been taking tramadol 50 mg 2 tablets 3 times a day, the combination helped her pain better, but she complains of increased side effect  She denies rash broke out, denies visual loss, hearing loss,  UPDATE Sept 29 2021: Her left facial pain for a while was under reasonable control with gabapentin 300 mg 2 tablets 3 times a day, Trileptal 300 mg 1 and half tablet twice a day, in June 2021, she attempted to decrease the medications, that had flareup of her trigeminal pain, call the office August 26, 20 21, Trileptal was increased from 300 twice daily to 450  twice daily, which has helped her symptoms  She complains of some side effect from the medications, dizziness, lightheadedness, unsteadiness  Update April 03, 2020 SS: Here today alone, back in December, she hit left side of her head in the attic, had a flare, lasting 4 weeks. She increased slightly Trileptal 1.5 tablets, 2 in the evening. Gabapentin 600 mg twice daily, never takes a midday dose. Claims in Feb, took gabapentin first before the Trileptal (usually takes 2 hours apart), heart rate increased to 170, was racing, went to primary care. Wore heart monitor, doesn't have the results yet. Keeps metoprolol on hand. Has started eating piece of toast. Pain is level 1 or 2, very cautious, careful. PCP checked blood work.  Does feel her balance is not as good, but admits to inactivity, works from home, is rather sedentary.  In September 2021, Trileptal level was 20, CMP was unremarkable. Had labs done from PCP, sodium level 141, creatinine 0.7, liver function was normal.  REVIEW OF SYSTEMS: Out of a complete 14 system review of symptoms, the patient complains only of the following symptoms, and all other reviewed systems are negative.  Facial pain  ALLERGIES: Allergies  Allergen Reactions  . Penicillins Hives    HOME MEDICATIONS: Outpatient Medications Prior to Visit  Medication Sig Dispense Refill  . atorvastatin (LIPITOR) 10 MG tablet Take 10 mg by mouth daily.    . Cholecalciferol (VITAMIN D3 PO) Take 1,000 Units by mouth daily.    . cyanocobalamin 1000 MCG tablet Take 1,000 mcg by mouth daily.    Marland Kitchen  fexofenadine (ALLEGRA) 180 MG tablet Take 180 mg by mouth daily.    . fexofenadine-pseudoephedrine (ALLEGRA-D 24) 180-240 MG 24 hr tablet Take 1 tablet by mouth daily as needed.    . hydrochlorothiazide (HYDRODIURIL) 25 MG tablet Take 25 mg by mouth daily.    Marland Kitchen losartan (COZAAR) 100 MG tablet Take 100 mg by mouth daily.    . metoprolol tartrate (LOPRESSOR) 50 MG tablet Take 50 mg by mouth  as needed.    . ASPIRIN 81 PO Take 1 tablet by mouth daily.    Marland Kitchen gabapentin (NEURONTIN) 300 MG capsule Take 2 capsules (600 mg total) by mouth 3 (three) times daily. (Patient taking differently: Take 300 mg by mouth 2 (two) times daily. Half tab in the am 2 tabs in pm) 540 capsule 4  . Oxcarbazepine (TRILEPTAL) 300 MG tablet Take 1.5 tablets (450 mg total) by mouth 2 (two) times daily. 270 tablet 4   No facility-administered medications prior to visit.    PAST MEDICAL HISTORY: Past Medical History:  Diagnosis Date  . Chronic maxillary sinusitis   . History of kidney stones   . Hyperlipemia   . Hypertension   . Left facial pain     PAST SURGICAL HISTORY: Past Surgical History:  Procedure Laterality Date  . RECTOCELE REPAIR    . TAH w/ single oophorectomy      FAMILY HISTORY: Family History  Problem Relation Age of Onset  . Ovarian cancer Mother   . Suicidality Father        age 10  . Melanoma Sister   . Breast cancer Neg Hx     SOCIAL HISTORY: Social History   Socioeconomic History  . Marital status: Married    Spouse name: Not on file  . Number of children: 2  . Years of education: college  . Highest education level: Associate degree: academic program  Occupational History  . Occupation: Astronomer  Tobacco Use  . Smoking status: Never Smoker  . Smokeless tobacco: Never Used  Substance and Sexual Activity  . Alcohol use: Yes    Comment: rare  . Drug use: Never  . Sexual activity: Not on file  Other Topics Concern  . Not on file  Social History Narrative   Lives at home with husband.   Right-handed.   Caffeine: occasional use.   Social Determinants of Health   Financial Resource Strain: Not on file  Food Insecurity: Not on file  Transportation Needs: Not on file  Physical Activity: Not on file  Stress: Not on file  Social Connections: Not on file  Intimate Partner Violence: Not on file   PHYSICAL EXAM  Vitals:   04/04/20 1235  BP: 138/83   Pulse: 85  Weight: 200 lb (90.7 kg)  Height: 5\' 4"  (1.626 m)   Body mass index is 34.33 kg/m.  Generalized: Well developed, in no acute distress   Neurological examination  Mentation: Alert oriented to time, place, history taking. Follows all commands speech and language fluent Cranial nerve II-XII: Pupils were equal round reactive to light. Extraocular movements were full, visual field were full on confrontational test. Facial sensation and strength were normal. Head turning and shoulder shrug  were normal and symmetric. Motor: The motor testing reveals 5 over 5 strength of all 4 extremities. Good symmetric motor tone is noted throughout.  Sensory: Sensory testing is intact to soft touch on all 4 extremities. No evidence of extinction is noted.  Coordination: Cerebellar testing reveals good finger-nose-finger and heel-to-shin  bilaterally.  Gait and station: Gait is normal.  Tandem gait is normal. Reflexes: Deep tendon reflexes are symmetric and normal bilaterally.  DIAGNOSTIC DATA (LABS, IMAGING, TESTING) - I reviewed patient records, labs, notes, testing and imaging myself where available.  Lab Results  Component Value Date   WBC 6.9 05/02/2019   HGB 13.3 05/02/2019   HCT 39.7 05/02/2019   MCV 88 05/02/2019      Component Value Date/Time   NA 139 10/04/2019 1315   K 4.4 10/04/2019 1315   CL 100 10/04/2019 1315   CO2 24 10/04/2019 1315   GLUCOSE 104 (H) 10/04/2019 1315   BUN 10 10/04/2019 1315   CREATININE 0.62 10/04/2019 1315   CALCIUM 10.3 10/04/2019 1315   PROT 7.0 10/04/2019 1315   ALBUMIN 4.7 10/04/2019 1315   AST 19 10/04/2019 1315   ALT 21 10/04/2019 1315   ALKPHOS 71 10/04/2019 1315   BILITOT 0.5 10/04/2019 1315   GFRNONAA 97 10/04/2019 1315   GFRAA 112 10/04/2019 1315   No results found for: CHOL, HDL, LDLCALC, LDLDIRECT, TRIG, CHOLHDL No results found for: KWIO9B No results found for: VITAMINB12 Lab Results  Component Value Date   TSH 1.330 05/02/2019     ASSESSMENT AND PLAN 65 y.o. year old female 1. Left trigeminal neuralgia involving left V2 branch,  -MRI of the brain with and without contrast in May 2021 did not show significant abnormality -Recurrent left-sided facial pain currently under good control (currently taking Trileptal 300 mg, 1.5 tablets AM/2 tablets PM; gabapentin 600 mg twice daily) -Since under good control, will try to cut back Trileptal 300 mg, 1.5 tablets twice daily, keep gabapentin -Reviewed labs from PCP Feb 2022, CBC, CMP were okay -Call for increasing pain, otherwise follow-up in 8 to 9 months  I spent 20 minutes of face-to-face and non-face-to-face time with patient.  This included previsit chart review, lab review, study review, order entry, electronic health record documentation, patient education.  Otila Kluver, DNP  [facility] [address], [address] [place], [place] [address] [phone]

## 2020-04-04 NOTE — Patient Instructions (Signed)
Continue current medications  Reviewed your labs from PCP  See you back in 8-9 months

## 2020-06-17 ENCOUNTER — Other Ambulatory Visit: Payer: Self-pay | Admitting: Internal Medicine

## 2020-06-17 DIAGNOSIS — Z1231 Encounter for screening mammogram for malignant neoplasm of breast: Secondary | ICD-10-CM

## 2020-06-21 DIAGNOSIS — E785 Hyperlipidemia, unspecified: Secondary | ICD-10-CM | POA: Diagnosis not present

## 2020-06-21 DIAGNOSIS — R739 Hyperglycemia, unspecified: Secondary | ICD-10-CM | POA: Diagnosis not present

## 2020-06-21 DIAGNOSIS — Z0000 Encounter for general adult medical examination without abnormal findings: Secondary | ICD-10-CM | POA: Diagnosis not present

## 2020-06-28 DIAGNOSIS — Z0000 Encounter for general adult medical examination without abnormal findings: Secondary | ICD-10-CM | POA: Diagnosis not present

## 2020-06-28 DIAGNOSIS — Z1331 Encounter for screening for depression: Secondary | ICD-10-CM | POA: Diagnosis not present

## 2020-06-28 DIAGNOSIS — Z1339 Encounter for screening examination for other mental health and behavioral disorders: Secondary | ICD-10-CM | POA: Diagnosis not present

## 2020-06-28 DIAGNOSIS — E785 Hyperlipidemia, unspecified: Secondary | ICD-10-CM | POA: Diagnosis not present

## 2020-06-28 DIAGNOSIS — I10 Essential (primary) hypertension: Secondary | ICD-10-CM | POA: Diagnosis not present

## 2020-07-02 DIAGNOSIS — H04123 Dry eye syndrome of bilateral lacrimal glands: Secondary | ICD-10-CM | POA: Diagnosis not present

## 2020-07-02 DIAGNOSIS — H524 Presbyopia: Secondary | ICD-10-CM | POA: Diagnosis not present

## 2020-07-02 DIAGNOSIS — H43813 Vitreous degeneration, bilateral: Secondary | ICD-10-CM | POA: Diagnosis not present

## 2020-07-02 DIAGNOSIS — H2513 Age-related nuclear cataract, bilateral: Secondary | ICD-10-CM | POA: Diagnosis not present

## 2020-07-16 DIAGNOSIS — Z6832 Body mass index (BMI) 32.0-32.9, adult: Secondary | ICD-10-CM | POA: Diagnosis not present

## 2020-07-16 DIAGNOSIS — Z01419 Encounter for gynecological examination (general) (routine) without abnormal findings: Secondary | ICD-10-CM | POA: Diagnosis not present

## 2020-08-09 ENCOUNTER — Ambulatory Visit
Admission: RE | Admit: 2020-08-09 | Discharge: 2020-08-09 | Disposition: A | Discharge status: Home / Self Care | Payer: BC Managed Care – PPO | Source: Ambulatory Visit | Attending: Internal Medicine | Admitting: Internal Medicine

## 2020-08-09 ENCOUNTER — Other Ambulatory Visit: Payer: Self-pay

## 2020-08-09 DIAGNOSIS — Z1231 Encounter for screening mammogram for malignant neoplasm of breast: Secondary | ICD-10-CM

## 2020-10-01 ENCOUNTER — Encounter: Payer: Self-pay | Admitting: Cardiology

## 2020-10-01 ENCOUNTER — Other Ambulatory Visit: Payer: Self-pay

## 2020-10-01 ENCOUNTER — Ambulatory Visit: Payer: BC Managed Care – PPO | Admitting: Cardiology

## 2020-10-01 DIAGNOSIS — I479 Paroxysmal tachycardia, unspecified: Secondary | ICD-10-CM | POA: Diagnosis not present

## 2020-10-01 MED ORDER — METOPROLOL TARTRATE 50 MG PO TABS
50.0000 mg | ORAL_TABLET | ORAL | 6 refills | Status: DC | PRN
Start: 2020-10-01 — End: 2022-12-02

## 2020-10-01 NOTE — Patient Instructions (Signed)
Medication Instructions:   May use Metoprolol as needed  *If you need a refill on your cardiac medications before your next appointment, please call your pharmacy*   Lab Work: Not needed    Testing/Procedures:  Not needed  Follow-Up: At [facility], you and your health needs are our priority.  As part of our continuing mission to provide you with exceptional heart care, we have created designated Provider Care Teams.  These Care Teams include your primary Cardiologist (physician) and Advanced Practice Providers (APPs -  Physician Assistants and Nurse Practitioners) who all work together to provide you with the care you need, when you need it.     Your next appointment:   6 month(s)  The format for your next appointment:   In Person  Provider:   Bryan Lemma, MD   Other Instructions   Recommendations for vagal maneuvers: "Bearing down" Coughing Gagging Icy, cold towel on face or drink ice cold water      DR HARDING RECOMMENDS THAT YOU  PURCHASES  " Kardia" By [employer]. FROM THE  [employer]  APP PLAY STORE.   THE APP IS FREE , BUT THE  EQUIPMENT HAS A COST. IT IS A WAY FOR YOU TO OBTAIN A RECORDING OF YOUR HEART RATE . IT WILL BE A SHORT RHYTHM  STRIP THAT YOU CAN SHOW TO MEDICAL STAFF.  ALSO , YOU MAY WANT RESEARCH [employer] - E[employer] - EKG- MONITOR YOU ARE ABLE TO KEEP RECORDINGS ON YOUR SMARTPHONE , INSTEAD OF PURCHASING A SEPARATE EQUIPMENT   Dr Herbie Baltimore  recommends portable  ECG monitor  to capture heart rate reading. There is a monitor that can be purchased on [employer]. The  monitor name is "EMAY"  at last checked it cost $79.  Our understanding there no more cost of that purchasing the machine. You will need a computer to be able to upload your recording and to be printed. It also come with a  USB to charge the monitor.

## 2020-10-01 NOTE — Progress Notes (Signed)
Primary Care Provider: Melida Quitter, MD Cardiologist: None Electrophysiologist: None Neurologist: Dr. Terrace Arabia OB/Gyn Dr. Senaida Ores  Clinic Note: Chief Complaint  Patient presents with   New Patient (Initial Visit)    Paroxysmal tachycardia    ===================================  ASSESSMENT/PLAN   Problem List Items Addressed This Visit       Cardiology Problems   Paroxysmal tachycardia (HCC) (Chronic)    Most likely SSX of PSVT.  It is possible that high carb load the night before with cheese and wine could have created a tyramine surge.  More than likely, she was dehydrated which led to a triggered event.  Previously wore monitor - no findings. - as infrequently as her monitors are occurring, I do not think that a 30-day monitor would be enough to capture an episode.  As such, I think a home monitor is the best option.  No abnormal findings on cardiac exam.  Since she is relatively asymptomatic otherwise, I do not know that we necessarily need to evaluate with an echocardiogram unless we have documented evidence of an arrhythmia.  Recommend Kardia (home) Monitor ->  Discussed Vagal Maneuvers, avoiding triggers (dehydration, excess stress, caffeine, sweets, alcohol-notably wine and cheese) & adequate hydration.  Recommendations for vagal maneuvers: "Bearing down" Coughing Gagging Icy, cold towel on face or drink ice cold water  Refilled PRN metoprolol 50 mg       Relevant Medications   metoprolol tartrate (LOPRESSOR) 50 MG tablet   Other Relevant Orders   EKG 12-Lead (Completed)    ===================================  HPI:    Heather Jenkins is a 65 y.o. female with a PMH notable for HTN fibromyalgia with prior history of Tachycardia who is being seen today for the evaluation of Recurrent Paroxysmal Tachycardia at the request of Wile, [name], MD.  Heather Jenkins was seen on 06/28/2020 by Dr. Nadene Rubins for routine physical examination.  Noted that BP was stable on current  meds.  Apparently the switch from losartan to valsartan did not happen.  At this visit, she denied any chest pain or palpitations.  No dyspnea.  Trying to follow a healthy diet, having hard time.  Not exercising as much as she would like to, but trying.  Bothered by trigeminal neuralgia. ->  No real changes made at this time.  -> She called in on 09/13/2020 with complaints of an episode of tachycardia that happened the previous weekend and was referred to cardiology.  Recent Hospitalizations: None  Reviewed  CV studies:    The following studies were reviewed today: (if available, images/films reviewed: From Epic Chart or Care Everywhere) March 2022 14-day Event Monitor: Sinus rhythm.  Very rare PACs and rare PVCs.  No arrhythmias noted.  Heart rate range 67-148 bpm with an average of 90 bpm.   Interval History:   Heather Jenkins presents here today after contacting her PCPs office back on September 9 describing another episode of fast heart rate earlier that week.  It about 6 months since her last episode of tachycardia and never had her metoprolol filled.  She says that she has had 2 or 3 episodes this calendar year like this.  The first 1 was in February, the second was on September 4.  The other 1 was not as pronounced shortly after the first.  -> See noted that she was doing relatively well.  He was trying to start exercising by walking 30 minutes twice a day.  On the day in question, she had about  3 minutes after her walk, and looked on her walks and saw that her heart rate was 160 bpm.  She says that usually at the end of the walk she gets a little lightheaded and short of breath, but never sees her heart rate that fast.  She took metoprolol and sat down and the heart rate increased to 200 beats a minute and she felt like her heart was beating out of her chest.  After several minutes, the heart rate slowed down and then went back to normal. -->  It just took her several hours to recover back to  feeling normal.  She just felt achy afterwards.  To me, she describes that when this episode happened she just felt strange and shaky all over.  She felt shaky inside.  She did note having up big dinner with Pasta and shrimp the previous evening but has not had any significant amount of caffeine and minimal alcohol.  Otherwise, she feels okay from a cardiac standpoint.  She is actually been trying to pick up her exercise and walks 30 minutes twice a day once in the morning once in the evening.  Usually she does fine with this and the longer she has been doing it she feels better at the end.  She tries to do her best to stay hydrated, but does note that sometimes she may not drink enough.  She says that at baseline when she is finishing her exercise though she feels a little shaky and a little bit lightheaded but it only lasts a minute or 2.  On these 2 occasions specifically, she felt that her heart rate all of a sudden jumped from maybe a little high at the end of exercise up to 180 in February and 200 on this last occasion.  Both times, she was able to sit down take denies easy breaths and relax.  They lasted about 10 to 15 minutes, and interestingly, stopped about his abruptly as a started.  She did feel very fatigued and tired after each one of the spells but denied any chest pain or pressure.  She felt lightheaded and dizzy but did not pass out.  She did feel little short of breath, but thinks he was probably little anxious too.  She was not anxious before the spells but definitely became anxious after onset.  She does point out that the fast heart rate spells are very regular in nature.  Besides these 2 episodes she really does not notice a lot of irregular heartbeats or palpitations.  Pretty much asymptomatic from a cardiac standpoint.  CV Review of Symptoms (Summary) Cardiovascular ROS: no chest pain or dyspnea on exertion positive for - palpitations, rapid heart rate, and these spells or  episodes are associated with lightheadedness dizziness and near syncope with shortness of breath negative for - edema, irregular heartbeat, loss of consciousness, orthopnea, paroxysmal nocturnal dyspnea, or TIA/amaurosis fugax or claudication  REVIEWED OF SYSTEMS   Review of Systems  Constitutional:  Negative for malaise/fatigue and weight loss.  HENT:  Negative for congestion and nosebleeds.   Respiratory:  Negative for cough and shortness of breath.   Cardiovascular:  Negative for claudication.       Tachycardia spells per HPI  Gastrointestinal:  Negative for abdominal pain, blood in stool, heartburn and melena.  Genitourinary:  Negative for hematuria.  Musculoskeletal:  Negative for joint pain and myalgias.  Neurological:  Positive for headaches. Negative for dizziness, focal weakness and weakness.  Being treated for trigeminal neuralgia with gabapentin and Trileptal  Psychiatric/Behavioral: Negative.         She admits that she may get anxious every now and then, but not troubled by it routinely.   I have reviewed and (if needed) personally updated the patient's problem list, medications, allergies, past medical and surgical history, social and family history.   PAST MEDICAL HISTORY   Past Medical History:  Diagnosis Date   Chronic maxillary sinusitis    History of kidney stones    Hyperlipemia    Hypertension    Trigeminal neuralgia of left side of face     PAST SURGICAL HISTORY   Past Surgical History:  Procedure Laterality Date   PARS PLANA REPAIR OF RETINAL DEATACHMENT Right    Unsure of details   RECTOCELE REPAIR     TAH w/ single oophorectomy      There is no immunization history on file for this patient.  MEDICATIONS/ALLERGIES   Current Meds  Medication Sig   atorvastatin (LIPITOR) 10 MG tablet Take 10 mg by mouth daily.   Cholecalciferol (VITAMIN D3 PO) Take 1,000 Units by mouth daily.   cyanocobalamin 1000 MCG tablet Take 1,000 mcg by mouth daily.    fexofenadine-pseudoephedrine (ALLEGRA-D 24) 180-240 MG 24 hr tablet Take 1 tablet by mouth daily as needed.   gabapentin (NEURONTIN) 300 MG capsule Take 2 capsules (600 mg total) by mouth 3 (three) times daily. (Patient taking differently: Take 600 mg by mouth 2 (two) times daily.)   hydrochlorothiazide (HYDRODIURIL) 25 MG tablet Take 25 mg by mouth daily.   losartan (COZAAR) 100 MG tablet Take 100 mg by mouth daily.   Oxcarbazepine (TRILEPTAL) 300 MG tablet Take 1.5 tablets (450 mg total) by mouth 2 (two) times daily. (Patient taking differently: Take 450 mg by mouth in the morning, at noon, and at bedtime. 1 and a half tab in the AM and 2 tabs in the PM)    Allergies  Allergen Reactions   Penicillins Hives    SOCIAL HISTORY/FAMILY HISTORY   Reviewed in Epic:  Pertinent findings:  Social History   Tobacco Use   Smoking status: Never   Smokeless tobacco: Never  Substance Use Topics   Alcohol use: Yes    Comment: rare   Drug use: Never   Social History   Social History Narrative   Lives at home with husband.   2 children aged 50 and 57 (as of 2022)   Right-handed.   Caffeine: occasional use.   Exercises 7 days a week walking.    OBJCTIVE -PE, EKG, labs   Wt Readings from Last 3 Encounters:  10/01/20 189 lb (85.7 kg)  04/04/20 200 lb (90.7 kg)  10/04/19 203 lb 9.6 oz (92.4 kg)    Physical Exam: BP 130/80 (BP Location: Left Arm)   Pulse 87   Ht 5\' 4"  (1.626 m)   Wt 189 lb (85.7 kg)   SpO2 98%   BMI 32.44 kg/m  She keeps intermittent blood pressure log with pressures running from 122/70-140/78 mmHg.  On average 134/75 mmHg Physical Exam Vitals reviewed.  Constitutional:      General: She is not in acute distress.    Appearance: Normal appearance. She is obese. She is not ill-appearing (Healthy-appearing.  Well-groomed.) or toxic-appearing.  HENT:     Head: Normocephalic and atraumatic.  Eyes:     Extraocular Movements: Extraocular movements intact.      Pupils: Pupils are equal, round, and reactive to light.  Neck:     Vascular: No carotid bruit or JVD.  Cardiovascular:     Rate and Rhythm: Normal rate and regular rhythm. No extrasystoles are present.    Chest Wall: PMI is not displaced.     Pulses: Normal pulses.     Heart sounds: S1 normal and S2 normal. No murmur heard.   No friction rub. No gallop.  Pulmonary:     Effort: Pulmonary effort is normal. No respiratory distress.     Breath sounds: Normal breath sounds. No wheezing, rhonchi or rales.  Abdominal:     General: Abdomen is flat. Bowel sounds are normal. There is no distension.     Palpations: Abdomen is soft. There is no mass.     Tenderness: There is no abdominal tenderness.     Comments: No HSM or bruit  Musculoskeletal:        General: No swelling. Normal range of motion.     Cervical back: Normal range of motion and neck supple.  Skin:    General: Skin is warm and dry.  Neurological:     General: No focal deficit present.     Mental Status: She is alert and oriented to person, place, and time.     Cranial Nerves: No cranial nerve deficit.     Motor: No weakness.  Psychiatric:        Mood and Affect: Mood normal.        Behavior: Behavior normal.        Thought Content: Thought content normal.        Judgment: Judgment normal.     Adult ECG Report  Rate: 87 ;  Rhythm: normal sinus rhythm and normal axis, intervals and durations.  Nonspecific ST and T wave changes. ;   Narrative Interpretation: Relatively normal  Recent Labs:   06/21/2020: TC 187, TG 197, HDL 44, LDL 104.  A1c 6.0.  Hgb 13.9, BUN 10 Cr 0.62, K+ 4.0. Glu 101 Ca++ 9.7.  AST 17, ALT 21, TSH 1.34.;  WBC 7.33, H/H14.3/42.0, PLT 294 No results found for: CHOL, HDL, LDLCALC, LDLDIRECT, TRIG, CHOLHDL Lab Results  Component Value Date   CREATININE 0.62 10/04/2019   BUN 10 10/04/2019   NA 139 10/04/2019   K 4.4 10/04/2019   CL 100 10/04/2019   CO2 24 10/04/2019   CBC Latest Ref Rng & Units  05/02/2019  WBC 3.4 - 10.8 x10E3/uL 6.9  Hemoglobin 11.1 - 15.9 g/dL 50.0  Hematocrit 37.0 - 46.6 % 39.7    No results found for: HGBA1C Lab Results  Component Value Date   TSH 1.330 05/02/2019    ==================================================  COVID-19 Education: The signs and symptoms of COVID-19 were discussed with the patient and how to seek care for testing (follow up with PCP or arrange E-visit).    I spent a total of 37 minutes with the patient spent in direct patient consultation.  Additional time spent with chart review  / charting (studies, outside notes, etc): 25 min Total Time: 62 min  Current medicines are reviewed at length with the patient today.  (+/- concerns) none  This visit occurred during the SARS-CoV-2 public health emergency.  Safety protocols were in place, including screening questions prior to the visit, additional usage of staff PPE, and extensive cleaning of exam room while observing appropriate contact time as indicated for disinfecting solutions.  Notice: This dictation was prepared with Dragon dictation along with smart phrase technology. Any transcriptional errors that result from this process are  unintentional and may not be corrected upon review.  Patient Instructions / Medication Changes & Studies & Tests Ordered   Patient Instructions  Medication Instructions:   May use Metoprolol as needed  *If you need a refill on your cardiac medications before your next appointment, please call your pharmacy*   Lab Work: Not needed    Testing/Procedures:  Not needed  Follow-Up: At [facility], you and your health needs are our priority.  As part of our continuing mission to provide you with exceptional heart care, we have created designated Provider Care Teams.  These Care Teams include your primary Cardiologist (physician) and Advanced Practice Providers (APPs -  Physician Assistants and Nurse Practitioners) who all work together to provide  you with the care you need, when you need it.     Your next appointment:   6 month(s)  The format for your next appointment:   In Person  Provider:   Bryan Lemma, MD   Other Instructions   Recommendations for vagal maneuvers: "Bearing down" Coughing Gagging Icy, cold towel on face or drink ice cold water      DR Trinidad Petron RECOMMENDS THAT YOU  PURCHASES  " Kardia" By [employer]. FROM THE  [employer]  APP PLAY STORE.   THE APP IS FREE , BUT THE  EQUIPMENT HAS A COST. IT IS A WAY FOR YOU TO OBTAIN A RECORDING OF YOUR HEART RATE . IT WILL BE A SHORT RHYTHM  STRIP THAT YOU CAN SHOW TO MEDICAL STAFF.  ALSO , YOU MAY WANT RESEARCH [employer] EMAY - EKG- MONITOR YOU ARE ABLE TO KEEP RECORDINGS ON YOUR SMARTPHONE , INSTEAD OF PURCHASING A SEPARATE EQUIPMENT   Dr Herbie Baltimore  recommends portable  ECG monitor  to capture heart rate reading. There is a monitor that can be purchased on [employer]. The  monitor name is "EMAY"  at last checked it cost $79.  Our understanding there no more cost of that purchasing the machine. You will need a computer to be able to upload your recording and to be printed. It also come with a  USB to charge the monitor.    Studies Ordered:   Orders Placed This Encounter  Procedures   EKG 12-Lead     Bryan Lemma, M.D., M.S. Interventional Cardiologist   Pager # [phone] Phone # [phone] [address] [place], [place] [address]   Thank you for choosing Heartcare at [facility]!!

## 2020-10-01 NOTE — Assessment & Plan Note (Addendum)
Most likely SSX of PSVT.  It is possible that high carb load the night before with cheese and wine could have created a tyramine surge.  More than likely, she was dehydrated which led to a triggered event.  Previously wore monitor - no findings. - as infrequently as her monitors are occurring, I do not think that a 30-day monitor would be enough to capture an episode.  As such, I think a home monitor is the best option.  No abnormal findings on cardiac exam.  Since she is relatively asymptomatic otherwise, I do not know that we necessarily need to evaluate with an echocardiogram unless we have documented evidence of an arrhythmia.  Recommend Kardia (home) Monitor ->   Discussed Vagal Maneuvers, avoiding triggers (dehydration, excess stress, caffeine, sweets, alcohol-notably wine and cheese) & adequate hydration.  Recommendations for vagal maneuvers:  "Bearing down"  Coughing  Gagging  Icy, cold towel on face or drink ice cold water   Refilled PRN metoprolol 50 mg

## 2020-10-05 ENCOUNTER — Encounter: Payer: Self-pay | Admitting: Cardiology

## 2020-10-30 ENCOUNTER — Encounter: Payer: Self-pay | Admitting: Internal Medicine

## 2020-11-27 DIAGNOSIS — J029 Acute pharyngitis, unspecified: Secondary | ICD-10-CM | POA: Diagnosis not present

## 2020-11-27 DIAGNOSIS — I10 Essential (primary) hypertension: Secondary | ICD-10-CM | POA: Diagnosis not present

## 2020-12-02 NOTE — Progress Notes (Signed)
[facility] Note  12/04/2020     CHIEF COMPLAINT Patient presents for Retina Follow Up   HISTORY OF PRESENT ILLNESS: Heather Jenkins is a 65 y.o. female who presents to the clinic today for:   HPI     Retina Follow Up   Patient presents with  Retinal Break/Detachment.  In right eye.  Severity is mild.  Duration of 1 year.  Since onset it is stable.  I, the attending physician,  performed the HPI with the patient and updated documentation appropriately.        Comments   Pt here for 1 year ret f/u for ret tear OD. Pt states vision is doing well. Pt has new glasses but she sticks with her old ones. Reports rxs arent much different anyway. No ocular pain or discomfort.       Last edited by Bernarda Caffey, MD on 12/06/2020  9:41 PM.    Pt states she has a "heart racing thing" going on, she states it has only happened twice, she was seen by a cardiologist who started her on metoprolol if it happens again, pt states she has a "fluttering" in her inferior vision when she first wakes up in the morning and also if she's in a dark room, she has started using gel drops at night and they seem to help a little   Referring physician: Marygrace Drought, MD [place],  [place] [id number]  HISTORICAL INFORMATION:   Selected notes from the [facility] Referred by Dr. Satira Sark for retinal tear OD   CURRENT MEDICATIONS: No current outpatient medications on file. (Ophthalmic Drugs)   No current facility-administered medications for this visit. (Ophthalmic Drugs)   Current Outpatient Medications (Other)  Medication Sig   atorvastatin (LIPITOR) 10 MG tablet Take 10 mg by mouth daily.   Cholecalciferol (VITAMIN D3 PO) Take 1,000 Units by mouth daily.   gabapentin (NEURONTIN) 300 MG capsule Take 2 capsules (600 mg total) by mouth 3 (three) times daily. (Patient taking differently: Take 600 mg by mouth 2 (two) times daily.)   hydrochlorothiazide  (HYDRODIURIL) 25 MG tablet Take 25 mg by mouth daily.   losartan (COZAAR) 100 MG tablet Take 100 mg by mouth daily.   metoprolol tartrate (LOPRESSOR) 50 MG tablet Take 1 tablet (50 mg total) by mouth as needed.   Oxcarbazepine (TRILEPTAL) 300 MG tablet Take 1.5 tablets (450 mg total) by mouth 2 (two) times daily. (Patient taking differently: Take 450 mg by mouth in the morning, at noon, and at bedtime. 1 and a half tab in the AM and 2 tabs in the PM)   cyanocobalamin 1000 MCG tablet Take 1,000 mcg by mouth daily. (Patient not taking: Reported on 12/04/2020)   fexofenadine-pseudoephedrine (ALLEGRA-D 24) 180-240 MG 24 hr tablet Take 1 tablet by mouth daily as needed. (Patient not taking: Reported on 12/04/2020)   No current facility-administered medications for this visit. (Other)   REVIEW OF SYSTEMS: ROS   Positive for: Eyes Negative for: Constitutional, Gastrointestinal, Neurological, Skin, Genitourinary, Musculoskeletal, HENT, Endocrine, Cardiovascular, Respiratory, Psychiatric, Allergic/Imm, Heme/Lymph Last edited by Kingsley Spittle, COT on 12/04/2020  8:27 AM.     ALLERGIES Allergies  Allergen Reactions   Penicillins Hives   PAST MEDICAL HISTORY Past Medical History:  Diagnosis Date   Chronic maxillary sinusitis    History of kidney stones    Hyperlipemia    Hypertension    SVT (supraventricular tachycardia) ([facility])  Trigeminal neuralgia of left side of face    Past Surgical History:  Procedure Laterality Date   PARS PLANA REPAIR OF RETINAL DEATACHMENT Right    Unsure of details   RECTOCELE REPAIR     TAH w/ single oophorectomy      FAMILY HISTORY Family History  Problem Relation Age of Onset   Ovarian cancer Mother    Heart disease Mother        Not sure of details   Hypertension Mother    Suicidality Father        age 62   Melanoma Sister    Heart attack Brother 76       First MI in his 72s to 27s   Breast cancer Neg Hx     SOCIAL HISTORY Social History    Tobacco Use   Smoking status: Never   Smokeless tobacco: Never  Substance Use Topics   Alcohol use: Yes    Comment: rare   Drug use: Never       OPHTHALMIC EXAM:  Base Eye Exam     Visual Acuity (Snellen - Linear)       Right Left   Dist cc 20/30 +1 20/30   Dist ph cc 20/25 NI    Correction: Glasses         Tonometry (Tonopen, 8:34 AM)       Right Left   Pressure 15 13         Pupils       Dark Light Shape React APD   Right 3 2 Round Brisk None   Left 3 2 Round Brisk None         Visual Fields (Counting fingers)       Left Right    Full Full         Extraocular Movement       Right Left    Full, Ortho Full, Ortho         Neuro/Psych     Oriented x3: Yes   Mood/Affect: Normal         Dilation     Both eyes: 1.0% Mydriacyl, 2.5% Phenylephrine @ 8:35 AM           Slit Lamp and Fundus Exam     Slit Lamp Exam       Right Left   Lids/Lashes Dermatochalasis - upper lid Dermatochalasis - upper lid   Conjunctiva/Sclera White and quiet White and quiet   Cornea Trace inferior Punctate epithelial erosions, mild arcus 1+Punctate epithelial erosions   Anterior Chamber Deep and quiet Deep and quiet   Iris Round and dilated Round and dilated   Lens 2+ Nuclear sclerosis, 2+ Cortical cataract 2+ Nuclear sclerosis, 2+ Cortical cataract   Anterior Vitreous Vitreous syneresis, trace, fine pigment, Posterior vitreous detachment, Weiss ring, vitreous condensations Vitreous syneresis, no pigment         Fundus Exam       Right Left   Disc Pink and Sharp, temporal PPA, tilted, Compact Pink and Sharp, temporal PPA, tilted   C/D Ratio 0.4 0.4   Macula Flat, Blunted foveal reflex, mild RPE mottling and clumping, No heme or edema Flat, good foveal reflex, mild RPE mottling and clumping, No heme or edema   Vessels mild attenuation, mild tortuousity mild attenuation, mild tortuousity   Periphery Attached, pigmented paving stone degeneration  inferiorly at 0600, HST at 1230 -- good laser surrounding, No new RT/RD Attached, focal pigment VR tuft at 0430 equator, pigmented  lattice from 5-7 oclock, -- good laser surrounding all lesions, mild pigmented paving stone degeneration inferior; no new RT/RD           Refraction     Wearing Rx       Sphere Cylinder Axis Add   Right -9.00 Sphere  +2.25   Left -7.25 +0.50 076 +2.25    Type: PAL            IMAGING AND PROCEDURES  Imaging and Procedures for _0 @  OCT, Retina - OU - Both Eyes       Right Eye Quality was good. Central Foveal Thickness: 229. Progression has been stable. Findings include normal foveal contour, no IRF, no SRF, myopic contour (Focal ERM and pucker SN macula).   Left Eye Quality was good. Central Foveal Thickness: 239. Progression has been stable. Findings include normal foveal contour, no IRF, no SRF, myopic contour, vitreomacular adhesion .   Notes *Images captured and stored on drive  Diagnosis / Impression:  NFP, no IRF/SRF OU Myopic contour OU  Clinical management:  See below  Abbreviations: NFP - Normal foveal profile. CME - cystoid macular edema. PED - pigment epithelial detachment. IRF - intraretinal fluid. SRF - subretinal fluid. EZ - ellipsoid zone. ERM - epiretinal membrane. ORA - outer retinal atrophy. ORT - outer retinal tubulation. SRHM - subretinal hyper-reflective material            ASSESSMENT/PLAN:    ICD-10-CM   1. Retinal tear of right eye  H33.311     2. Retinal edema  H35.81 OCT, Retina - OU - Both Eyes    3. Right retinal detachment  H33.21     4. Lattice degeneration of left retina  H35.412     5. Retinal hole of left eye  H33.322     6. Posterior vitreous detachment of right eye  H43.811     7. Essential hypertension  I10     8. Hypertensive retinopathy of both eyes  H35.033     9. Combined forms of age-related cataract of both eyes  H25.813       1-3. Horseshoe tear w/ cuff of SRF / focal  RD, OD  - HST located at 1230 with cuff of SRF  - s/p laser retinopexy OD (06.16.21) -- good laser surrounding  - f/u PRN  4,5. Lattice degeneration w/ atrophic holes, left eye - pigmented lattice from 5-7 oclock - s/p laser retinopexy OS (08.09.21) -- good laser surrounding - monitor  ** pt is cleared from a retina standpoint for release to Dr. Satira Sark and resumption of primary eye care ** pt can f/u here prn  6. PVD / vitreous syneresis OD  - pt reports appearance of floaters back in November 2020 and was seen by Dr. Delman Cheadle  - Discussed findings and prognosis  - RT/focal RD identified OD as above  - Reviewed s/s of RT/RD  - Strict return precautions for any such RT/RD signs/symptoms  - monitor  7,8. Hypertensive retinopathy OU  - discussed importance of tight BP control  - monitor  9. Mixed form cataract OU  - under the expert management of Dr. Satira Sark  - not yet visually significant  Ophthalmic Meds Ordered this visit:  No orders of the defined types were placed in this encounter.    Return if symptoms worsen or fail to improve.  There are no Patient Instructions on file for this visit.  Explained the diagnoses, plan, and follow up with the patient and they  expressed understanding.  Patient expressed understanding of the importance of proper follow up care.    This document serves as a record of services personally performed by Gardiner Sleeper, MD, PhD. It was created on their behalf by Orvan Falconer, an ophthalmic technician. The creation of this record is the provider's dictation and/or activities during the visit.    Electronically signed by: Orvan Falconer, OA, 12/06/20  9:52 PM  This document serves as a record of services personally performed by Gardiner Sleeper, MD, PhD. It was created on their behalf by San Jetty. Owens Shark, OA an ophthalmic technician. The creation of this record is the provider's dictation and/or activities during the visit.    Electronically  signed by: San Jetty. Owens Shark, [place] 11.30.2022 9:52 PM  Gardiner Sleeper, M.D., Ph.D. Diseases & Surgery of the Retina and Vitreous [facility]  I have reviewed the above documentation for accuracy and completeness, and I agree with the above. Gardiner Sleeper, M.D., Ph.D. 12/06/20 9:52 PM   Abbreviations: M myopia (nearsighted); A astigmatism; H hyperopia (farsighted); P presbyopia; Mrx spectacle prescription;  CTL contact lenses; OD right eye; OS left eye; OU both eyes  XT exotropia; ET esotropia; PEK punctate epithelial keratitis; PEE punctate epithelial erosions; DES dry eye syndrome; MGD meibomian gland dysfunction; ATs artificial tears; PFAT's preservative free artificial tears; [place] nuclear sclerotic cataract; PSC posterior subcapsular cataract; ERM epi-retinal membrane; PVD posterior vitreous detachment; RD retinal detachment; DM diabetes mellitus; DR diabetic retinopathy; NPDR non-proliferative diabetic retinopathy; PDR proliferative diabetic retinopathy; CSME clinically significant macular edema; DME diabetic macular edema; dbh dot blot hemorrhages; CWS cotton wool spot; POAG primary open angle glaucoma; C/D cup-to-disc ratio; HVF humphrey visual field; GVF goldmann visual field; OCT optical coherence tomography; IOP intraocular pressure; BRVO Branch retinal vein occlusion; CRVO central retinal vein occlusion; CRAO central retinal artery occlusion; BRAO branch retinal artery occlusion; RT retinal tear; SB scleral buckle; PPV pars plana vitrectomy; VH Vitreous hemorrhage; PRP panretinal laser photocoagulation; IVK intravitreal kenalog; VMT vitreomacular traction; MH Macular hole;  NVD neovascularization of the disc; NVE neovascularization elsewhere; AREDS age related eye disease study; ARMD age related macular degeneration; POAG primary open angle glaucoma; EBMD epithelial/anterior basement membrane dystrophy; ACIOL anterior chamber intraocular lens; IOL intraocular lens; PCIOL  posterior chamber intraocular lens; Phaco/IOL phacoemulsification with intraocular lens placement; [place] photorefractive keratectomy; LASIK laser assisted in situ keratomileusis; HTN hypertension; DM diabetes mellitus; COPD chronic obstructive pulmonary disease

## 2020-12-04 ENCOUNTER — Ambulatory Visit (INDEPENDENT_AMBULATORY_CARE_PROVIDER_SITE_OTHER): Payer: BC Managed Care – PPO | Admitting: Ophthalmology

## 2020-12-04 ENCOUNTER — Other Ambulatory Visit: Payer: Self-pay

## 2020-12-04 ENCOUNTER — Encounter (INDEPENDENT_AMBULATORY_CARE_PROVIDER_SITE_OTHER): Payer: Self-pay | Admitting: Ophthalmology

## 2020-12-04 DIAGNOSIS — H35412 Lattice degeneration of retina, left eye: Secondary | ICD-10-CM

## 2020-12-04 DIAGNOSIS — H35033 Hypertensive retinopathy, bilateral: Secondary | ICD-10-CM | POA: Diagnosis not present

## 2020-12-04 DIAGNOSIS — I10 Essential (primary) hypertension: Secondary | ICD-10-CM

## 2020-12-04 DIAGNOSIS — H25813 Combined forms of age-related cataract, bilateral: Secondary | ICD-10-CM

## 2020-12-04 DIAGNOSIS — H43811 Vitreous degeneration, right eye: Secondary | ICD-10-CM

## 2020-12-04 DIAGNOSIS — H33311 Horseshoe tear of retina without detachment, right eye: Secondary | ICD-10-CM | POA: Diagnosis not present

## 2020-12-04 DIAGNOSIS — H3321 Serous retinal detachment, right eye: Secondary | ICD-10-CM

## 2020-12-04 DIAGNOSIS — H3581 Retinal edema: Secondary | ICD-10-CM

## 2020-12-04 DIAGNOSIS — H33322 Round hole, left eye: Secondary | ICD-10-CM | POA: Diagnosis not present

## 2020-12-05 DIAGNOSIS — I10 Essential (primary) hypertension: Secondary | ICD-10-CM | POA: Diagnosis not present

## 2020-12-05 DIAGNOSIS — R739 Hyperglycemia, unspecified: Secondary | ICD-10-CM | POA: Diagnosis not present

## 2020-12-05 DIAGNOSIS — E785 Hyperlipidemia, unspecified: Secondary | ICD-10-CM | POA: Diagnosis not present

## 2020-12-06 ENCOUNTER — Encounter (INDEPENDENT_AMBULATORY_CARE_PROVIDER_SITE_OTHER): Payer: Self-pay | Admitting: Ophthalmology

## 2020-12-07 NOTE — Progress Notes (Signed)
Chart reviewed, agree above plan ?

## 2020-12-09 ENCOUNTER — Ambulatory Visit: Payer: BC Managed Care – PPO | Admitting: Neurology

## 2020-12-11 ENCOUNTER — Ambulatory Visit: Payer: BC Managed Care – PPO | Admitting: Neurology

## 2020-12-11 ENCOUNTER — Encounter: Payer: Self-pay | Admitting: Neurology

## 2020-12-11 VITALS — BP 127/80 | HR 75 | Ht 64.0 in | Wt 184.0 lb

## 2020-12-11 DIAGNOSIS — G500 Trigeminal neuralgia: Secondary | ICD-10-CM

## 2020-12-11 NOTE — Progress Notes (Signed)
HISTORY OF PRESENT ILLNESS: Heather Jenkins is a 65 year old female, seen in request by her primary care physician Dr. Jacalyn Lefevre, Jesse Sans, for evaluation of intermittent left facial pain, she is accompanied by her husband at today's clinical visit on May 02, 2019.   I have reviewed and summarized the referring note from the referring physician.  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   She reported intermittent left facial pain, shooting from left cheek to left nose since September 2019, initial episode was short lasting, intermittent, she was treated as sinus infection   She had recurrent episode again in June 2020, lasting for 3 months, no diagnosis was made   She had most severe episode since February 2021, still ongoing, she reported the pain is present three-quarter of the time, sharp severe radiating pain from left cheek to left inner eye corner, worsening by teeth brushing, chewing, talking, touching her face,   CT sinus from [facility] on April 25, 2019, amount of mucosal thickening at the left greater than right inferior maxillary sinus, minimum LOCATION of the right ethmoid air cells,   Was treated with titrating dose of gabapentin which has helped her symptoms some, now taking 300 mg 2 tablets 3 times a day, complains of drowsiness, despite that, she has been taking tramadol 50 mg 2 tablets 3 times a day, the combination helped her pain better, but she complains of increased side effect   She denies rash broke out, denies visual loss, hearing loss,  UPDATE Sept 29 2021: Her left facial pain for a while was under reasonable control with gabapentin 300 mg 2 tablets 3 times a day, Trileptal 300 mg 1 and half tablet twice a day, in June 2021, she attempted to decrease the medications, that had flareup of her trigeminal pain, call the office August 26, 20 21, Trileptal was increased from 300 twice daily to 450  twice daily, which has helped her symptoms  She complains of some side effect from the medications, dizziness, lightheadedness, unsteadiness  Update April 03, 2020 SS: Here today alone, back in December, she hit left side of her head in the attic, had a flare, lasting 4 weeks. She increased slightly Trileptal 1.5 tablets, 2 in the evening. Gabapentin 600 mg twice daily, never takes a midday dose. Claims in Feb, took gabapentin first before the Trileptal (usually takes 2 hours apart), heart rate increased to 170, was racing, went to primary care. Wore heart monitor, doesn't have the results yet. Keeps metoprolol on hand. Has started eating piece of toast. Pain is level 1 or 2, very cautious, careful. PCP checked blood work.  Does feel her balance is not as good, but admits to inactivity, works from home, is rather sedentary.  In September 2021, Trileptal level was 20, CMP was unremarkable. Had labs done from PCP, sodium level 141, creatinine 0.7, liver function was normal.  Update December 11, 2020 SS: here today alone, TN pain is manageable, never gone. Is cautious, can cause pain if she rubs her left cheek in certain way. Overall well controlled, medications are working, afraid to reduce the dose, currently taking gabapentin 600 mg twice daily, Trileptal 300 mg 1.5 tablets in AM, 2 tablets PM. Many days she only takes gabapentin once daily. Has lost 13 lbs. 2 episodes of SVT seeing cardiology. Husband had MI back in summer, he is doing well.   I was able to review labs from  PCP visit 12/05/20, creatinine 0.7, sodium 140, AST 20, ALT 19, A1C 5.5.  REVIEW OF SYSTEMS: Out of a complete 14 system review of symptoms, the patient complains only of the following symptoms, and all other reviewed systems are negative.  Facial pain  ALLERGIES: Allergies  Allergen Reactions   Penicillins Hives    HOME MEDICATIONS: Outpatient Medications Prior to Visit  Medication Sig Dispense Refill   atorvastatin  (LIPITOR) 10 MG tablet Take 10 mg by mouth daily.     Cholecalciferol (VITAMIN D3 PO) Take 1,000 Units by mouth daily.     cyanocobalamin 1000 MCG tablet Take 1,000 mcg by mouth daily.     gabapentin (NEURONTIN) 300 MG capsule Take 2 capsules (600 mg total) by mouth 3 (three) times daily. (Patient taking differently: Take 600 mg by mouth 2 (two) times daily.) 540 capsule 4   hydrochlorothiazide (HYDRODIURIL) 25 MG tablet Take 25 mg by mouth daily.     losartan (COZAAR) 100 MG tablet Take 100 mg by mouth daily.     metoprolol tartrate (LOPRESSOR) 50 MG tablet Take 1 tablet (50 mg total) by mouth as needed. 20 tablet 6   Oxcarbazepine (TRILEPTAL) 300 MG tablet Take 1.5 tablets (450 mg total) by mouth 2 (two) times daily. (Patient taking differently: Take 450 mg by mouth in the morning, at noon, and at bedtime. 1 and a half tab in the AM and 2 tabs in the PM) 270 tablet 4   fexofenadine-pseudoephedrine (ALLEGRA-D 24) 180-240 MG 24 hr tablet Take 1 tablet by mouth daily as needed. (Patient not taking: Reported on 12/04/2020)     No facility-administered medications prior to visit.    PAST MEDICAL HISTORY: Past Medical History:  Diagnosis Date   Chronic maxillary sinusitis    History of kidney stones    Hyperlipemia    Hypertension    SVT (supraventricular tachycardia) ([facility])    Trigeminal neuralgia of left side of face     PAST SURGICAL HISTORY: Past Surgical History:  Procedure Laterality Date   PARS PLANA REPAIR OF RETINAL DEATACHMENT Right    Unsure of details   RECTOCELE REPAIR     TAH w/ single oophorectomy      FAMILY HISTORY: Family History  Problem Relation Age of Onset   Ovarian cancer Mother    Heart disease Mother        Not sure of details   Hypertension Mother    Suicidality Father        age 25   Melanoma Sister    Heart attack Brother 62       First MI in his 73s to 59s   Breast cancer Neg Hx     SOCIAL HISTORY: Social History   Socioeconomic History    Marital status: Married    Spouse name: Not on file   Number of children: 2   Years of education: college   Highest education level: Associate degree: academic program  Occupational History   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   2 children aged 46 and 80 (as of 2022)   Right-handed.   Caffeine: occasional use.  Exercises 7 days a week walking.   Social Determinants of Health   Financial Resource Strain: Not on file  Food Insecurity: Not on file  Transportation Needs: Not on file  Physical Activity: Not on file  Stress: Not on file  Social Connections: Not on file  Intimate Partner Violence: Not on file   PHYSICAL EXAM  Vitals:   12/11/20 1452  BP: 127/80  Pulse: 75  Weight: 184 lb (83.5 kg)  Height: 5\' 4"  (1.626 m)    Body mass index is 31.58 kg/m.  Generalized: Well developed, in no acute distress    Neurological examination  Mentation: Alert oriented to time, place, history taking. Follows all commands speech and language fluent Cranial nerve II-XII: Pupils were equal round reactive to light. Extraocular movements were full, visual field were full on confrontational test. Facial sensation and strength were normal. Head turning and shoulder shrug  were normal and symmetric. Motor: The motor testing reveals 5 over 5 strength of all 4 extremities. Good symmetric motor tone is noted throughout.  Sensory: Sensory testing is intact to soft touch on all 4 extremities. No evidence of extinction is noted.  Coordination: Cerebellar testing reveals good finger-nose-finger and heel-to-shin bilaterally.  Gait and station: Gait is normal.  Tandem gait is normal. Reflexes: Deep tendon  reflexes are symmetric and normal bilaterally.  DIAGNOSTIC DATA (LABS, IMAGING, TESTING) - I reviewed patient records, labs, notes, testing and imaging myself where available.  Lab Results  Component Value Date   WBC 6.9 05/02/2019   HGB 13.3 05/02/2019   HCT 39.7 05/02/2019   MCV 88 05/02/2019      Component Value Date/Time   NA 139 10/04/2019 1315   K 4.4 10/04/2019 1315   CL 100 10/04/2019 1315   CO2 24 10/04/2019 1315   GLUCOSE 104 (H) 10/04/2019 1315   BUN 10 10/04/2019 1315   CREATININE 0.62 10/04/2019 1315   CALCIUM 10.3 10/04/2019 1315   PROT 7.0 10/04/2019 1315   ALBUMIN 4.7 10/04/2019 1315   AST 19 10/04/2019 1315   ALT 21 10/04/2019 1315   ALKPHOS 71 10/04/2019 1315   BILITOT 0.5 10/04/2019 1315   GFRNONAA 97 10/04/2019 1315   GFRAA 112 10/04/2019 1315   No results found for: CHOL, HDL, LDLCALC, LDLDIRECT, TRIG, CHOLHDL No results found for: 10/06/2019 No results found for: VITAMINB12 Lab Results  Component Value Date   TSH 1.330 05/02/2019    ASSESSMENT AND PLAN 65 y.o. year old female 1. Left trigeminal neuralgia involving left V2 branch,  -Pain under overall good control, wants to keep current dosage of medications, no adverse effect, reviewed recent labs from PCP, Trileptal level was 20 in Sept 2021, since then no issues -Will continue current dose gabapentin 300 mg, 2 capsules twice daily; Trileptal 300 mg 1.5 tablets AM, 2 tablets PM -MRI of the brain with and without contrast in May 2021 did not show significant abnormality -Call for worsening pain or adverse medication effect, otherwise return in 1 year or sooner if needed   June 2021, Margie Ege, DNP  [facility] [address] [place], [place] [phone]

## 2020-12-12 ENCOUNTER — Telehealth: Payer: Self-pay | Admitting: Neurology

## 2020-12-12 NOTE — Telephone Encounter (Signed)
Yearly f/u from 12/7 power outage scheduled for 12/17/21.

## 2020-12-26 DIAGNOSIS — L723 Sebaceous cyst: Secondary | ICD-10-CM | POA: Diagnosis not present

## 2020-12-26 DIAGNOSIS — D1801 Hemangioma of skin and subcutaneous tissue: Secondary | ICD-10-CM | POA: Diagnosis not present

## 2020-12-26 DIAGNOSIS — L814 Other melanin hyperpigmentation: Secondary | ICD-10-CM | POA: Diagnosis not present

## 2020-12-26 DIAGNOSIS — L821 Other seborrheic keratosis: Secondary | ICD-10-CM | POA: Diagnosis not present

## 2020-12-26 DIAGNOSIS — L570 Actinic keratosis: Secondary | ICD-10-CM | POA: Diagnosis not present

## 2021-01-09 DIAGNOSIS — L720 Epidermal cyst: Secondary | ICD-10-CM | POA: Diagnosis not present

## 2021-01-15 ENCOUNTER — Encounter: Payer: Self-pay | Admitting: Neurology

## 2021-01-28 ENCOUNTER — Telehealth: Payer: Self-pay | Admitting: Neurology

## 2021-01-28 ENCOUNTER — Encounter: Payer: Self-pay | Admitting: Neurology

## 2021-01-28 MED ORDER — PREGABALIN 100 MG PO CAPS: 100.0000 mg | ORAL_CAPSULE | Freq: Three times a day (TID) | ORAL | 0 refills | Status: DC

## 2021-01-28 NOTE — Telephone Encounter (Signed)
I called the patient, is taking gabapentin 300 mg, 2 capsules 3 times daily, was only taking 2 times daily up until 01/17/21, taking Trileptal 300 mg capsule, 2 capsules twice daily. The pain isn't all the time, but happens more frequently, commonly when eating, other times walking. Today it has happened 4 times. Is bothersome to her. Will stop the gabapentin, switch to Lyrica 100 mg up to 3 times daily. If no improvement will refer to [facility], Dr. Salomon Fick.

## 2021-02-18 ENCOUNTER — Telehealth: Payer: Self-pay | Admitting: Neurology

## 2021-02-18 DIAGNOSIS — G500 Trigeminal neuralgia: Secondary | ICD-10-CM

## 2021-02-18 MED ORDER — OXCARBAZEPINE 300 MG PO TABS: 600.0000 mg | ORAL_TABLET | Freq: Two times a day (BID) | ORAL | 4 refills | Status: DC

## 2021-02-18 NOTE — Telephone Encounter (Signed)
I called the patient, continued with left TN pain, despite Trileptal 300 mg, 2 tablets twice daily, Lyrica 100 mg 3 times daily, still having pain, jabs on the left.   Will increase Trileptal 600 mg AM, 300 mg midday, 600 mg PM.   Referral to Dr. Angelyn Punt.   Reach out in a few days consider higher dose Lyrica 150 mg 3 times daily

## 2021-02-19 ENCOUNTER — Telehealth: Payer: Self-pay | Admitting: Neurology

## 2021-02-19 NOTE — Telephone Encounter (Signed)
Sent to [facility] for Dr. Salomon Fick ph # [phone] opt 4

## 2021-02-20 IMAGING — MG DIGITAL SCREENING BILAT W/ TOMO W/ CAD
8 series · 8 of 24 positions shown · non-contrast
Comparison: Previous exam(s).

CLINICAL DATA: Screening.

EXAM:
DIGITAL SCREENING BILATERAL MAMMOGRAM WITH TOMO AND CAD

[L CC synth-2D]
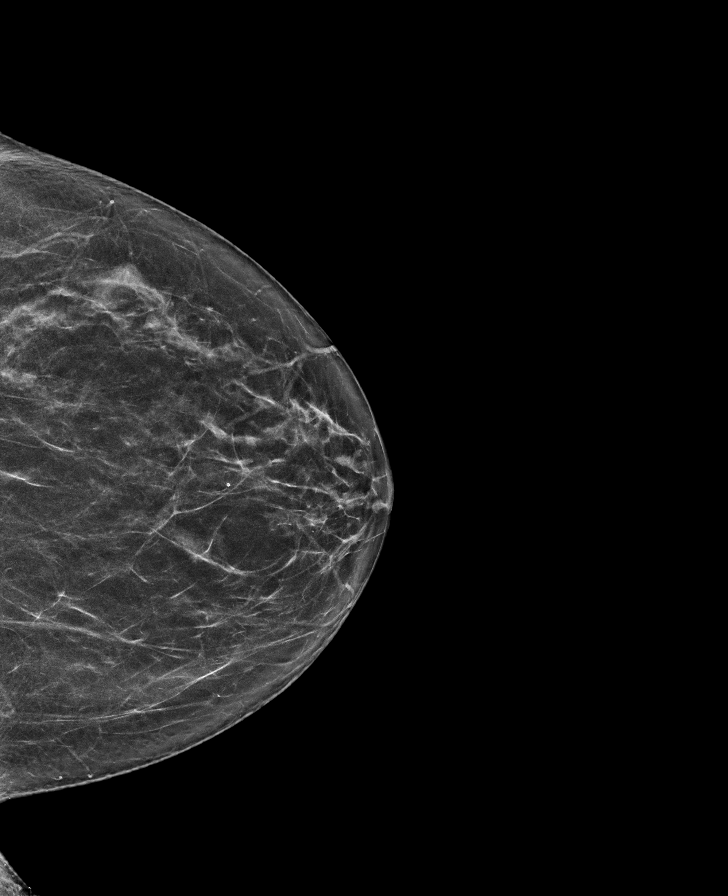

[R CC synth-2D]
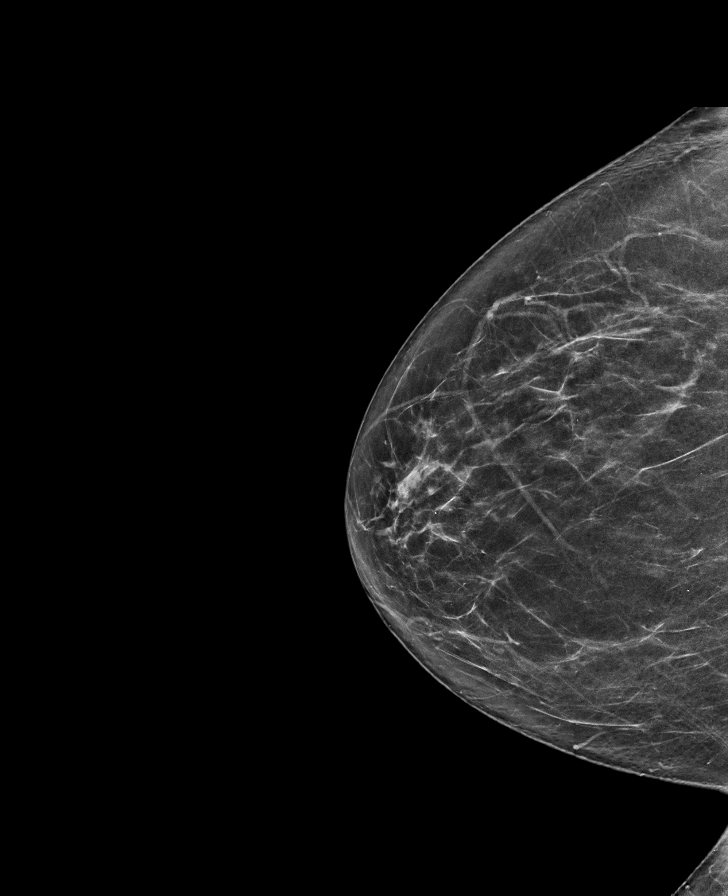

[L MLO synth-2D]
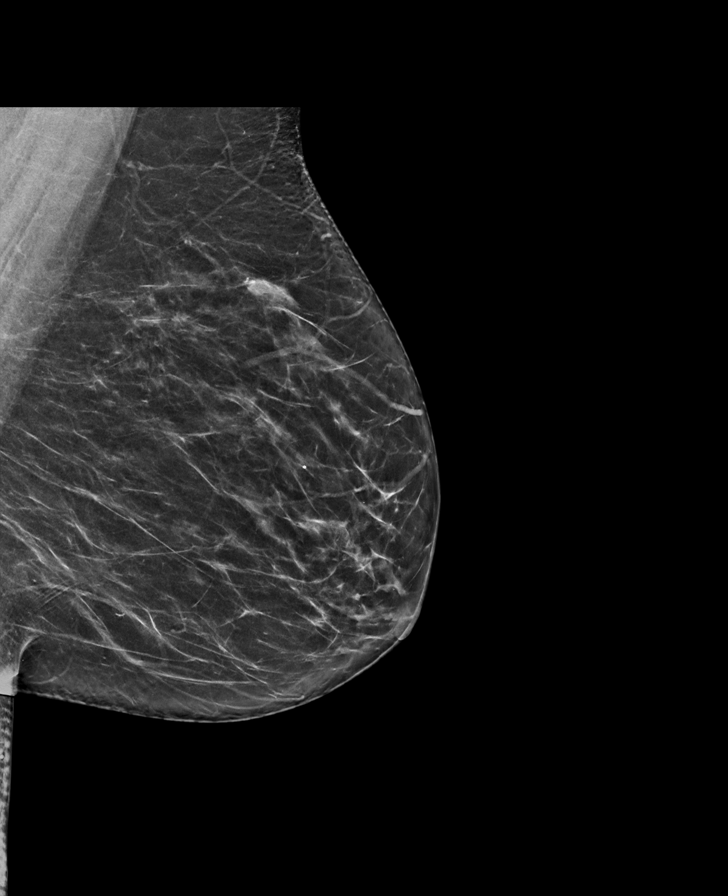

[R MLO synth-2D]
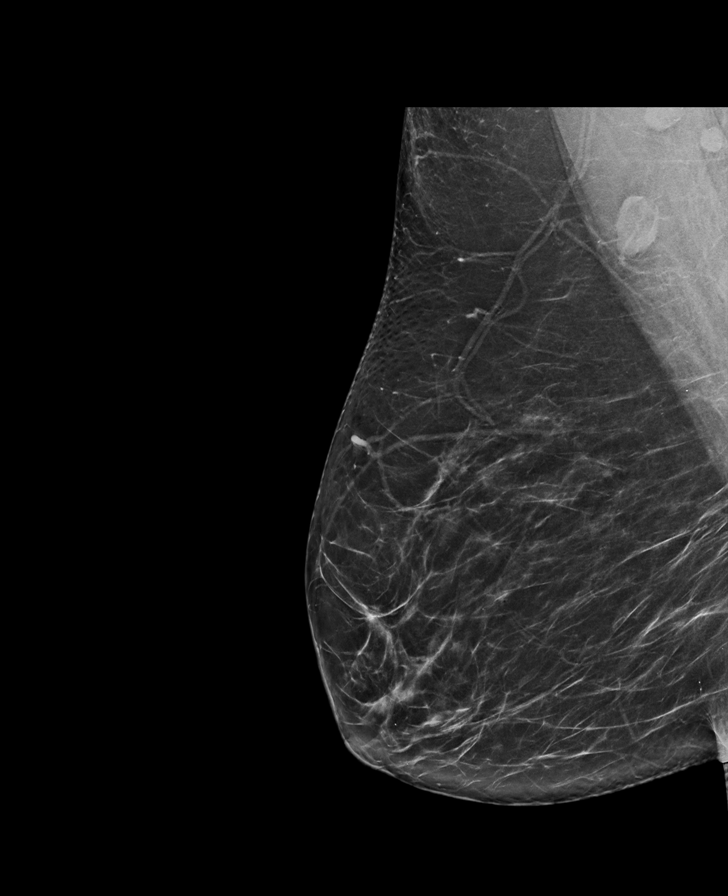

[R MLO tomo · tomo slice 39/77.0]
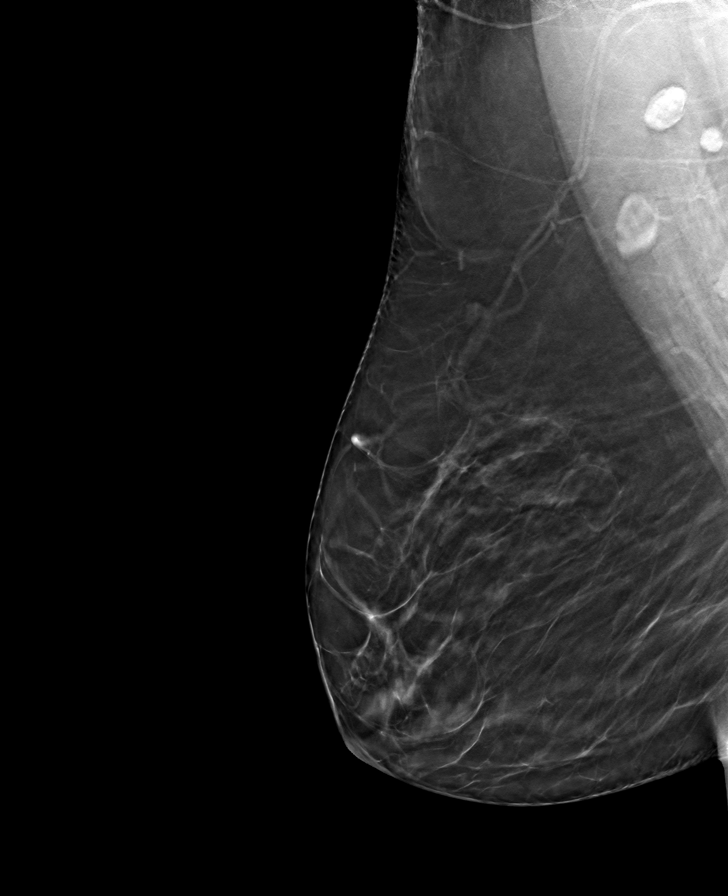

[L CC tomo · tomo slice 34/67.0]
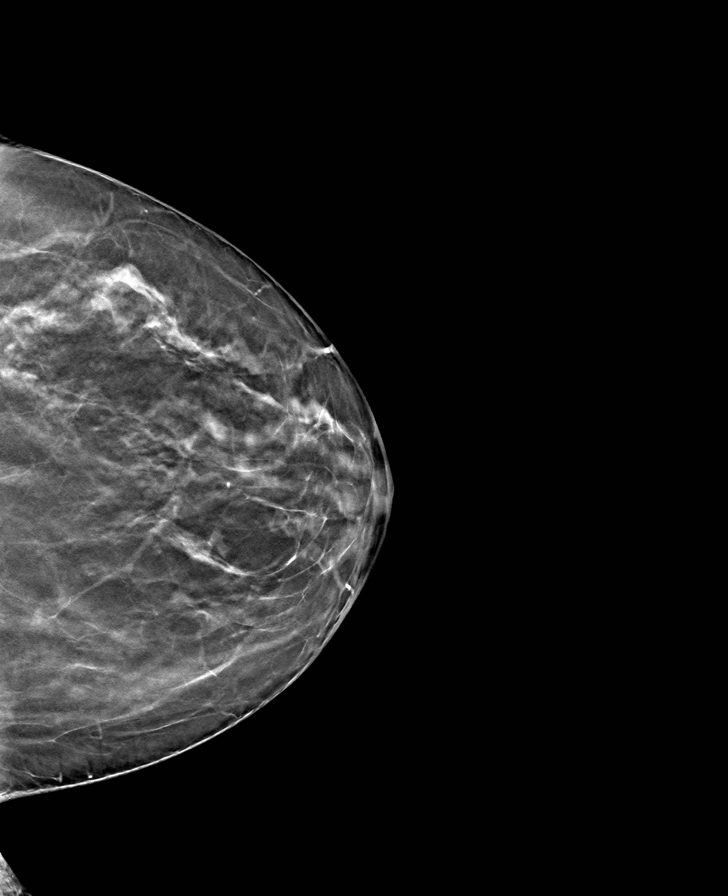

[R CC tomo · tomo slice 35/70.0]
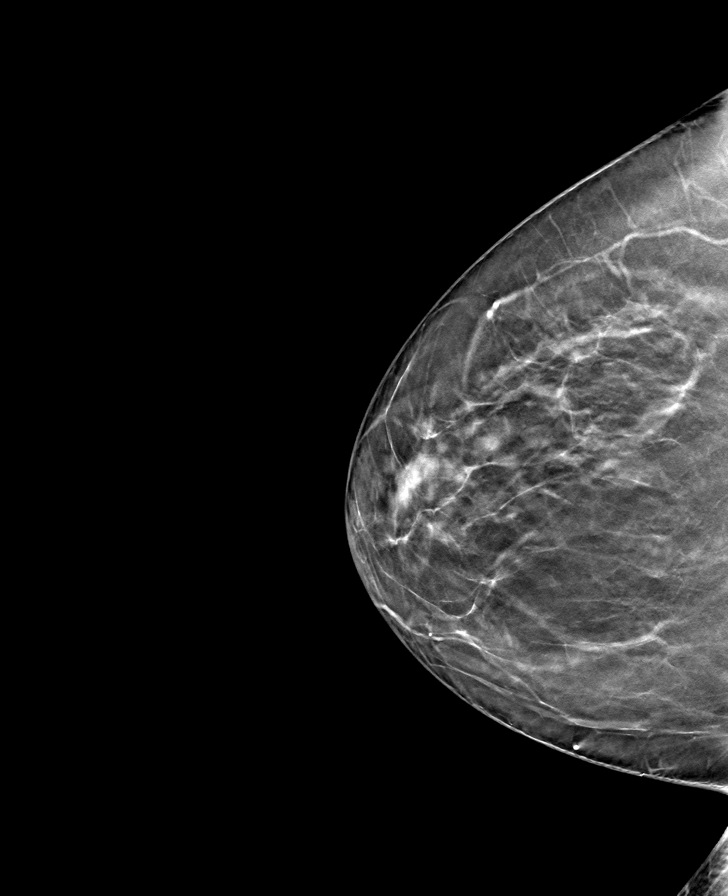

[L MLO tomo · tomo slice 37/72.0]
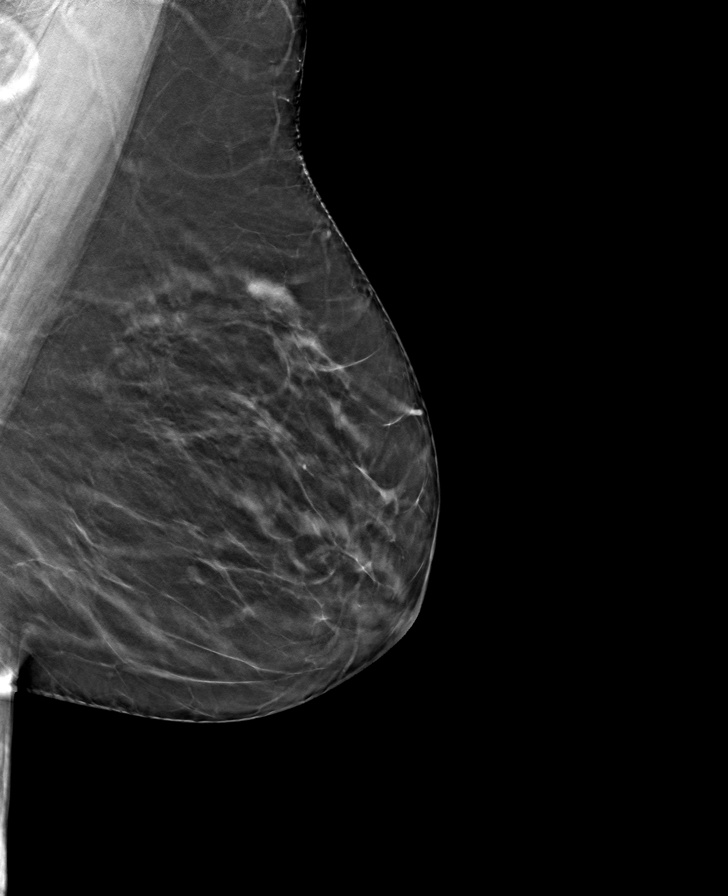

[8 of 24 positions shown; findings below may reference images not displayed]

ACR Breast Density Category b: There are scattered areas of
fibroglandular density.
FINDINGS: There are no findings suspicious for malignancy. Images were
processed with CAD.
IMPRESSION: No mammographic evidence of malignancy. A result letter of this
screening mammogram will be mailed directly to the patient.

RECOMMENDATION:
Screening mammogram in one year. (Code:[id number])

BI-RADS CATEGORY  1: Negative.

## 2021-02-26 ENCOUNTER — Other Ambulatory Visit: Payer: Self-pay | Admitting: *Deleted

## 2021-02-26 DIAGNOSIS — G500 Trigeminal neuralgia: Secondary | ICD-10-CM

## 2021-02-26 DIAGNOSIS — Z79899 Other long term (current) drug therapy: Secondary | ICD-10-CM

## 2021-02-26 MED ORDER — PREGABALIN 100 MG PO CAPS: 100.0000 mg | ORAL_CAPSULE | Freq: Three times a day (TID) | ORAL | 5 refills | Status: DC

## 2021-02-26 MED ORDER — OXCARBAZEPINE 300 MG PO TABS: ORAL_TABLET | ORAL | 3 refills | Status: DC

## 2021-03-04 ENCOUNTER — Other Ambulatory Visit (INDEPENDENT_AMBULATORY_CARE_PROVIDER_SITE_OTHER): Payer: Self-pay

## 2021-03-04 DIAGNOSIS — Z79899 Other long term (current) drug therapy: Secondary | ICD-10-CM | POA: Diagnosis not present

## 2021-03-04 DIAGNOSIS — G500 Trigeminal neuralgia: Secondary | ICD-10-CM | POA: Diagnosis not present

## 2021-03-04 DIAGNOSIS — Z0289 Encounter for other administrative examinations: Secondary | ICD-10-CM

## 2021-03-06 LAB — CBC WITH DIFFERENTIAL/PLATELET
Basophils Absolute: 0 10*3/uL (ref 0.0–0.2)
Basos: 1 %
EOS (ABSOLUTE): 0.1 10*3/uL (ref 0.0–0.4)
Eos: 2 %
Hematocrit: 42 % (ref 34.0–46.6)
Hemoglobin: 14.2 g/dL (ref 11.1–15.9)
Immature Grans (Abs): 0 10*3/uL (ref 0.0–0.1)
Immature Granulocytes: 0 %
Lymphocytes Absolute: 2.2 10*3/uL (ref 0.7–3.1)
Lymphs: 36 %
MCH: 30.1 pg (ref 26.6–33.0)
MCHC: 33.8 g/dL (ref 31.5–35.7)
MCV: 89 fL (ref 79–97)
Monocytes Absolute: 0.6 10*3/uL (ref 0.1–0.9)
Monocytes: 9 %
Neutrophils Absolute: 3.2 10*3/uL (ref 1.4–7.0)
Neutrophils: 52 %
Platelets: 336 10*3/uL (ref 150–450)
RBC: 4.72 x10E6/uL (ref 3.77–5.28)
RDW: 12.2 % (ref 11.7–15.4)
WBC: 6.1 10*3/uL (ref 3.4–10.8)

## 2021-03-06 LAB — COMPREHENSIVE METABOLIC PANEL
ALT: 17 IU/L (ref 0–32)
AST: 15 IU/L (ref 0–40)
Albumin/Globulin Ratio: 2.2 (ref 1.2–2.2)
Albumin: 4.9 g/dL — ABNORMAL HIGH (ref 3.8–4.8)
Alkaline Phosphatase: 79 IU/L (ref 44–121)
BUN/Creatinine Ratio: 23 (ref 12–28)
BUN: 15 mg/dL (ref 8–27)
Bilirubin Total: 0.3 mg/dL (ref 0.0–1.2)
CO2: 28 mmol/L (ref 20–29)
Calcium: 10.3 mg/dL (ref 8.7–10.3)
Chloride: 101 mmol/L (ref 96–106)
Creatinine, Ser: 0.65 mg/dL (ref 0.57–1.00)
Globulin, Total: 2.2 g/dL (ref 1.5–4.5)
Glucose: 96 mg/dL (ref 70–99)
Potassium: 4.6 mmol/L (ref 3.5–5.2)
Sodium: 141 mmol/L (ref 134–144)
Total Protein: 7.1 g/dL (ref 6.0–8.5)
eGFR: 98 mL/min/{1.73_m2} (ref >59)

## 2021-03-06 LAB — 10-HYDROXYCARBAZEPINE: Oxcarbazepine SerPl-Mcnc: 33 ug/mL (ref 10–35)

## 2021-03-12 ENCOUNTER — Telehealth: Payer: Self-pay | Admitting: Neurology

## 2021-03-12 NOTE — Telephone Encounter (Signed)
I called the patient, she was asking about seeing Dr Angelyn Punt and Dr. Johny Drilling at [facility], I told her this normal for her consultation. Her pain is well controlled. She was asking about needing an MRI disc for her visit, I will check with referrals and medical records to see if they are able to see our imaging? Her appointment is March 23rd.  ?

## 2021-03-20 MED ORDER — TRAMADOL HCL 50 MG PO TABS: 50.0000 mg | ORAL_TABLET | Freq: Four times a day (QID) | ORAL | 0 refills | Status: AC | PRN

## 2021-03-20 NOTE — Telephone Encounter (Signed)
Sent a my chart note, having more TN pain, I sent in Tramadol to take for severe pain PRN, she can increase Lyrica 100 mg up to 4 times daily, seeing Dr . Angelyn Punt next week.  ? ?Meds ordered this encounter  ?Medications  ? traMADol (ULTRAM) 50 MG tablet  ?  Sig: Take 1 tablet (50 mg total) by mouth every 6 (six) hours as needed.  ?  Dispense:  30 tablet  ?  Refill:  0  ? ? ?

## 2021-03-21 DIAGNOSIS — J0100 Acute maxillary sinusitis, unspecified: Secondary | ICD-10-CM | POA: Diagnosis not present

## 2021-03-27 DIAGNOSIS — G500 Trigeminal neuralgia: Secondary | ICD-10-CM | POA: Diagnosis not present

## 2021-03-31 ENCOUNTER — Telehealth: Payer: Self-pay | Admitting: Neurology

## 2021-03-31 NOTE — Telephone Encounter (Signed)
Pt has asked to be able to see Judson Roch, NP before her [facility] I appointment.  Pt declined Sarah's next available(which is in Aug) pt is asking if RN can call if anything should open for her in the month of April. ?

## 2021-03-31 NOTE — Telephone Encounter (Signed)
Spoke to pt offered her appt for 10/02/21 at 945, pt declined appt ?Pt has been added to wait list  ?

## 2021-04-01 NOTE — Telephone Encounter (Signed)
Patient was seen March 27, 2021 by Dr. Angelyn Punt, felt to be a good candidate for microvascular decompression, also a satisfactory candidate for gamma knife.  She was going to consider her options, let them know if she would like to proceed with treatment. ?

## 2021-04-01 NOTE — Telephone Encounter (Signed)
Pt declined, has another appointment on 3/30. Informed pt we'd keep an eye out for another cancellation. ?

## 2021-04-01 NOTE — Telephone Encounter (Signed)
Pt states next week or the following will work better  ?

## 2021-04-03 ENCOUNTER — Other Ambulatory Visit: Payer: Self-pay | Admitting: Neurology

## 2021-04-04 ENCOUNTER — Encounter: Payer: Self-pay | Admitting: Cardiology

## 2021-04-04 ENCOUNTER — Ambulatory Visit: Payer: BC Managed Care – PPO | Admitting: Cardiology

## 2021-04-04 VITALS — BP 130/68 | HR 70 | Ht 64.0 in | Wt 184.6 lb

## 2021-04-04 DIAGNOSIS — E7849 Other hyperlipidemia: Secondary | ICD-10-CM | POA: Diagnosis not present

## 2021-04-04 DIAGNOSIS — I479 Paroxysmal tachycardia, unspecified: Secondary | ICD-10-CM

## 2021-04-04 DIAGNOSIS — I10 Essential (primary) hypertension: Secondary | ICD-10-CM | POA: Diagnosis not present

## 2021-04-04 NOTE — Patient Instructions (Signed)
Medication Instructions:  ?Continue same medications ?*If you need a refill on your cardiac medications before your next appointment, please call your pharmacy* ? ? ?Lab Work: ?None ordered ? ? ?Testing/Procedures: ?None ordered ? ? ?Follow-Up: ?At [facility], you and your health needs are our priority.  As part of our continuing mission to provide you with exceptional heart care, we have created designated Provider Care Teams.  These Care Teams include your primary Cardiologist (physician) and Advanced Practice Providers (APPs -  Physician Assistants and Nurse Practitioners) who all work together to provide you with the care you need, when you need it. ? ?We recommend signing up for the patient portal called "MyChart".  Sign up information is provided on this After Visit Summary.  MyChart is used to connect with patients for Virtual Visits (Telemedicine).  Patients are able to view lab/test results, encounter notes, upcoming appointments, etc.  Non-urgent messages can be sent to your provider as well.   ?To learn more about what you can do with MyChart, go to [email or web address].   ? ?Your next appointment:  Oct     Call in July to schedule Oct appointment  ?  ? ?The format for your next appointment: Office ? ? ?Provider:  Dr.Harding ? ? ?

## 2021-04-04 NOTE — Progress Notes (Signed)
? ? ?Primary Care Provider: Michael Boston, MD ?Cardiologist: None ?Electrophysiologist: None ? ?Clinic Note: ?Chief Complaint  ?Patient presents with  ? Follow-up  ?  No prolonged rapid heart rate spells.  ? ?=================================== ? ?ASSESSMENT/PLAN  ? ?Problem List Items Addressed This Visit   ? ?  ? Cardiology Problems  ? Paroxysmal tachycardia (HCC) - Primary (Chronic)  ?  Doing much better.  Much less frequent. ? ?Continues to use [employer] - ? ?Would like to avoid treatment beyond her PRN Lopressor.  If symptoms become more consistent, would probably place her on just a low-dose starting beta-blocker. ?Discussed adequate hydration vagal maneuvers ?Avoiding triggers ?  ?  ? Essential hypertension (Chronic)  ?  Blood pressure was pretty well controlled on losartan and HCTZ. ? ?  ?  ? Hyperlipidemia (Chronic)  ?  Management PCP on atorvastatin.  Most recent LDL was 98 which is pretty much at goal.  I do not see recent labs from anything besides 2020. ? ?  ?  ?=================================== ? ?HPI:   ? ?Heather Jenkins is a 66 y.o. female with a PMH Notable for Paroxysmal Tachycardia, HTN and fibromyalgia who presents today for 51-month follow-up. ? ?Heather Jenkins was seen on 10/01/2020 for initial consultation at the request of Cristie Hem with episodic tachycardia-describing 2-3 episodes during the calendar year.  Initial 1 was in February, and again in September and then 1 just prior to her September visit.  Otherwise been doing well.  We will start an exercise up to 30 minutes a day and often twice daily.  The tachycardic episode that she called about began roughly 3 minutes after initiating the walk-she noted on her watch that her heart rate was 160 bpm.  She is a little bit lightheaded at the end of exercise associated with dyspnea.  Never feels her heart rate gets fast.  She took metoprolol, felt better and her heart rate initially increased to 200 beats minute before getting to normal  associated with feeling shaky all over.Marland Kitchen  Described as a sensation of "beating out of her chest ".  Just felt tired and achy afterwards.  Had a relatively large dinner with Boston from previous evening.  No significant caffeine or alcohol. ?Recommended outpatient monitor - [facility], based on the infrequency of symptoms.  (She has had a event monitor back in March 2020 which showed significant).   ? ?Recent Hospitalizations: None ? ?Reviewed  CV studies:   ? ?The following studies were reviewed today: (if available, images/films reviewed: From Epic Chart or Care Everywhere) ?None: ? ? ?Interval History:  ? ?Heather Jenkins presents here today doing fairly well.  No further episodes of heart rate spells.  She had 2 times where she checked her cardia mobile showing heart rates greater than 150 beats a minute.  She has noted a couple episodes of her heart rate go up a little faster than usual but nothing too fast. ? ?Mostly over the last few months has been dealing with trigeminal neuralgia. ? ?CV Review of Symptoms (Summary) ?Cardiovascular ROS: no chest pain or dyspnea on exertion ?positive for - irregular heartbeat, palpitations, rapid heart rate, and but not as fast and as long-lasting as before.  Nothing significant on Kardia mobile ?negative for - edema, orthopnea, paroxysmal nocturnal dyspnea, shortness of breath, or syncope/near syncope or TIA symptoms are suggestive of claudication ? ?REVIEWED OF SYSTEMS  ? ?Only notable for issues with trigeminal neuralgia.  Otherwise negative. ? ?I  have reviewed and (if needed) personally updated the patient's problem list, medications, allergies, past medical and surgical history, social and family history.  ? ?PAST MEDICAL HISTORY  ? ?Past Medical History:  ?Diagnosis Date  ? Chronic maxillary sinusitis   ? History of kidney stones   ? Hyperlipemia   ? Hypertension   ? SVT (supraventricular tachycardia) (HCC)   ? Trigeminal neuralgia of left side of face   ? ? ?PAST  SURGICAL HISTORY  ? ?Past Surgical History:  ?Procedure Laterality Date  ? PARS PLANA REPAIR OF RETINAL DEATACHMENT Right   ? Unsure of details  ? RECTOCELE REPAIR    ? TAH w/ single oophorectomy    ? ? ?There is no immunization history on file for this patient. ? ?MEDICATIONS/ALLERGIES  ? ?Current Meds  ?Medication Sig  ? atorvastatin (LIPITOR) 10 MG tablet Take 10 mg by mouth daily.  ? Cholecalciferol (VITAMIN D3 PO) Take 1,000 Units by mouth daily.  ? hydrochlorothiazide (HYDRODIURIL) 25 MG tablet Take 25 mg by mouth daily.  ? losartan (COZAAR) 100 MG tablet Take 100 mg by mouth daily.  ? metoprolol tartrate (LOPRESSOR) 50 MG tablet Take 1 tablet (50 mg total) by mouth as needed.  ? traMADol (ULTRAM) 50 MG tablet Take 1 tablet (50 mg total) by mouth every 6 (six) hours as needed.  ? [DISCONTINUED] Oxcarbazepine (TRILEPTAL) 300 MG tablet Take two tabs in am , one tab midday, two tabs in pm.  ? [DISCONTINUED] pregabalin (LYRICA) 100 MG capsule Take 1 capsule (100 mg total) by mouth 3 (three) times daily. (Patient taking differently: Take 100 mg by mouth in the morning, at noon, in the evening, and at bedtime.)  ? ? ?Allergies  ?Allergen Reactions  ? Penicillins Hives  ? ? ?SOCIAL HISTORY/FAMILY HISTORY  ? ?Reviewed in Epic:  ?Pertinent findings:  ?Social History  ? ?Tobacco Use  ? Smoking status: Never  ? Smokeless tobacco: Never  ?Substance Use Topics  ? Alcohol use: Yes  ?  Comment: rare  ? Drug use: Never  ? ?Social History  ? ?Social History Narrative  ? Lives at home with husband.  ? 2 children aged 43 and 39 (as of 2022)  ? Right-handed.  ? Caffeine: occasional use.  ? Exercises 7 days a week walking.  ? ? ?OBJCTIVE -PE, EKG, labs  ? ?Wt Readings from Last 3 Encounters:  ?04/09/21 184 lb (83.5 kg)  ?04/04/21 184 lb 9.6 oz (83.7 kg)  ?12/11/20 184 lb (83.5 kg)  ? ? ?Physical Exam: ?BP 130/68   Pulse 70   Ht 5\' 4"  (1.626 m)   Wt 184 lb 9.6 oz (83.7 kg)   SpO2 100%   BMI 31.69 kg/m?  ?Physical Exam ?Vitals  reviewed.  ?Constitutional:   ?   General: She is not in acute distress. ?   Appearance: Normal appearance. She is obese. She is not ill-appearing or toxic-appearing.  ?HENT:  ?   Head: Normocephalic and atraumatic.  ?Neck:  ?   Vascular: No carotid bruit.  ?Cardiovascular:  ?   Rate and Rhythm: Normal rate and regular rhythm.  ?   Pulses: Normal pulses.  ?   Heart sounds: Normal heart sounds. No murmur heard. ?  No friction rub. No gallop.  ?Pulmonary:  ?   Effort: Pulmonary effort is normal. No respiratory distress.  ?   Breath sounds: Normal breath sounds. No wheezing, rhonchi or rales.  ?Musculoskeletal:     ?   General: No swelling.  Normal range of motion.  ?   Cervical back: Normal range of motion and neck supple.  ?Skin: ?   General: Skin is warm and dry.  ?Neurological:  ?   General: No focal deficit present.  ?   Mental Status: She is alert and oriented to person, place, and time.  ?   Gait: Gait normal.  ?Psychiatric:     ?   Mood and Affect: Mood normal.     ?   Behavior: Behavior normal.     ?   Thought Content: Thought content normal.     ?   Judgment: Judgment normal.  ? ? Adult ECG Report ?N/a ? ?Recent Labs: Reviewed ?No results found for: CHOL, HDL, LDLCALC, LDLDIRECT, TRIG, CHOLHDL ?Lab Results  ?Component Value Date  ? CREATININE 0.58 04/09/2021  ? BUN 9 04/09/2021  ? NA 138 04/09/2021  ? K 4.2 04/09/2021  ? CL 98 04/09/2021  ? CO2 23 04/09/2021  ? ? ?  Latest Ref Rng & Units 03/04/2021  ? 12:00 PM 05/02/2019  ?  3:57 PM  ?CBC  ?WBC 3.4 - 10.8 x10E3/uL 6.1   6.9    ?Hemoglobin 11.1 - 15.9 g/dL 14.2   13.3    ?Hematocrit 34.0 - 46.6 % 42.0   39.7    ?Platelets 150 - 450 x10E3/uL 336     ? ? ?No results found for: HGBA1C ?Lab Results  ?Component Value Date  ? TSH 1.330 05/02/2019  ? ? ?================================================== ? ?COVID-19 Education: ?The signs and symptoms of COVID-19 were discussed with the patient and how to seek care for testing (follow up with PCP or arrange E-visit).    ? ?I spent a total of 18 minutes with the patient spent in direct patient consultation.  ?Additional time spent with chart review  / charting (studies, outside notes, etc): 14 min ?Total Time: 32 min ? ?Current me

## 2021-04-09 ENCOUNTER — Ambulatory Visit: Payer: BC Managed Care – PPO | Admitting: Neurology

## 2021-04-09 ENCOUNTER — Encounter: Payer: Self-pay | Admitting: Neurology

## 2021-04-09 VITALS — BP 156/83 | HR 82 | Ht 64.5 in | Wt 184.0 lb

## 2021-04-09 DIAGNOSIS — G500 Trigeminal neuralgia: Secondary | ICD-10-CM | POA: Diagnosis not present

## 2021-04-09 MED ORDER — OXCARBAZEPINE 300 MG PO TABS: ORAL_TABLET | ORAL | 3 refills | Status: DC

## 2021-04-09 MED ORDER — PREGABALIN 150 MG PO CAPS: 150.0000 mg | ORAL_CAPSULE | Freq: Three times a day (TID) | ORAL | 1 refills | Status: DC

## 2021-04-09 MED ORDER — LAMOTRIGINE 25 MG PO TABS: ORAL_TABLET | ORAL | 3 refills | Status: DC

## 2021-04-09 NOTE — Progress Notes (Signed)
? ?HISTORY OF PRESENT ILLNESS: ?Heather Jenkins is a 66 year old female, seen in request by her primary care physician Dr. Nadene Rubins, [name], for evaluation of intermittent left facial pain, she is accompanied by her husband at today's clinical visit on May 02, 2019. ?  ?I have reviewed and summarized the referring note from the referring physician.  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 ?  ?She reported intermittent left facial pain, shooting from left cheek to left nose since September 2019, initial episode was short lasting, intermittent, she was treated as sinus infection ?  ?She had recurrent episode again in June 2020, lasting for 3 months, no diagnosis was made ?  ?She had most severe episode since February 2021, still ongoing, she reported the pain is present three-quarter of the time, sharp severe radiating pain from left cheek to left inner eye corner, worsening by teeth brushing, chewing, talking, touching her face, ?  ?CT sinus from [facility] on April 25, 2019, amount of mucosal thickening at the left greater than right inferior maxillary sinus, minimum LOCATION of the right ethmoid air cells, ?  ?Was treated with titrating dose of gabapentin which has helped her symptoms some, now taking 300 mg 2 tablets 3 times a day, complains of drowsiness, despite that, she has been taking tramadol 50 mg 2 tablets 3 times a day, the combination helped her pain better, but she complains of increased side effect ?  ?She denies rash broke out, denies visual loss, hearing loss, ? ?UPDATE Sept 29 2021: ?Her left facial pain for a while was under reasonable control with gabapentin 300 mg 2 tablets 3 times a day, Trileptal 300 mg 1 and half tablet twice a day, in June 2021, she attempted to decrease the medications, that had flareup of her trigeminal pain, call the office August 26, 20 21, Trileptal was increased from 300 twice daily to 450  twice daily, which has helped her symptoms ? ?She complains of some side effect from the medications, dizziness, lightheadedness, unsteadiness ? ?Update April 03, 2020 SS: Here today alone, back in December, she hit left side of her head in the attic, had a flare, lasting 4 weeks. She increased slightly Trileptal 1.5 tablets, 2 in the evening. Gabapentin 600 mg twice daily, never takes a midday dose. Claims in Feb, took gabapentin first before the Trileptal (usually takes 2 hours apart), heart rate increased to 170, was racing, went to primary care. Wore heart monitor, doesn't have the results yet. Keeps metoprolol on hand. Has started eating piece of toast. Pain is level 1 or 2, very cautious, careful. PCP checked blood work.  Does feel her balance is not as good, but admits to inactivity, works from home, is rather sedentary. ? ?In September 2021, Trileptal level was 20, CMP was unremarkable. ?Had labs done from PCP, sodium level 141, creatinine 0.7, liver function was normal. ? ?Update December 11, 2020 SS: here today alone, TN pain is manageable, never gone. Is cautious, can cause pain if she rubs her left cheek in certain way. Overall well controlled, medications are working, afraid to reduce the dose, currently taking gabapentin 600 mg twice daily, Trileptal 300 mg 1.5 tablets in AM, 2 tablets PM. Many days she only takes gabapentin once daily. Has lost 13 lbs. 2 episodes of SVT seeing cardiology. Husband had MI back in summer, he is doing well.  ? ?I was able to review labs from  PCP visit 12/05/20, creatinine 0.7, sodium 140, AST 20, ALT 19, A1C 5.5. ? ?Update April 09, 2021 SS: Here today to discuss consult with Dr. Angelyn Puntatter and Dr. Johny Drillinghan. Right now taking Trileptal 600 mg AM, 300 mg midday, 600 mg PM. Lyrica 100 mg 4 times daily. Gamma knife scheduled next month. Feels like in a fog, too much medication taking Lyrica 4 times daily. Has tramadol is needed. Pain is getting worse to the left, having to hold left  side of face, worse when talking. Doesn't think can tolerate any higher doses. ? ?Labs 03/04/21 CBC and CMP normal, Trileptal level 33. ? ?REVIEW OF SYSTEMS: Out of a complete 14 system review of symptoms, the patient complains only of the following symptoms, and all other reviewed systems are negative. ? ?See HPI ? ?ALLERGIES: ?Allergies  ?Allergen Reactions  ? Penicillins Hives  ? ? ?HOME MEDICATIONS: ?Outpatient Medications Prior to Visit  ?Medication Sig Dispense Refill  ? atorvastatin (LIPITOR) 10 MG tablet Take 10 mg by mouth daily.    ? Cholecalciferol (VITAMIN D3 PO) Take 1,000 Units by mouth daily.    ? hydrochlorothiazide (HYDRODIURIL) 25 MG tablet Take 25 mg by mouth daily.    ? losartan (COZAAR) 100 MG tablet Take 100 mg by mouth daily.    ? metoprolol tartrate (LOPRESSOR) 50 MG tablet Take 1 tablet (50 mg total) by mouth as needed. 20 tablet 6  ? traMADol (ULTRAM) 50 MG tablet Take 1 tablet (50 mg total) by mouth every 6 (six) hours as needed. 30 tablet 0  ? Oxcarbazepine (TRILEPTAL) 300 MG tablet Take two tabs in am , one tab midday, two tabs in pm. 150 tablet 3  ? pregabalin (LYRICA) 100 MG capsule Take 1 capsule (100 mg total) by mouth 3 (three) times daily. (Patient taking differently: Take 100 mg by mouth in the morning, at noon, in the evening, and at bedtime.) 90 capsule 5  ? ?No facility-administered medications prior to visit.  ? ? ?PAST MEDICAL HISTORY: ?Past Medical History:  ?Diagnosis Date  ? Chronic maxillary sinusitis   ? History of kidney stones   ? Hyperlipemia   ? Hypertension   ? SVT (supraventricular tachycardia) (HCC)   ? Trigeminal neuralgia of left side of face   ? ? ?PAST SURGICAL HISTORY: ?Past Surgical History:  ?Procedure Laterality Date  ? PARS PLANA REPAIR OF RETINAL DEATACHMENT Right   ? Unsure of details  ? RECTOCELE REPAIR    ? TAH w/ single oophorectomy    ? ? ?FAMILY HISTORY: ?Family History  ?Problem Relation Age of Onset  ? Ovarian cancer Mother   ? Heart disease  Mother   ?     Not sure of details  ? Hypertension Mother   ? Suicidality Father   ?     age 65  ? Melanoma Sister   ? Heart attack Brother 45  ?     First MI in his 9640s to 2150s  ? Breast cancer Neg Hx   ? ? ?SOCIAL HISTORY: ?Social History  ? ?Socioeconomic History  ? Marital status: Married  ?  Spouse name: Not on file  ? Number of children: 2  ? Years of education: college  ? Highest education level: Associate degree: academic program  ?Occupational History  ? Occupation: Astronomeraccounts manager  ?  Employer: [employer]  ?Tobacco Use  ? Smoking status: Never  ? Smokeless tobacco: Never  ?Substance and Sexual Activity  ? Alcohol use: Yes  ?  Comment:  rare  ? Drug use: Never  ? Sexual activity: Not on file  ?Other Topics Concern  ? Not on file  ?Social History Narrative  ? Lives at home with husband.  ? 2 children aged 71 and 44 (as of 2022)  ? Right-handed.  ? Caffeine: occasional use.  ? Exercises 7 days a week walking.  ? ?Social Determinants of Health  ? ?Financial Resource Strain: Not on file  ?Food Insecurity: Not on file  ?Transportation Needs: Not on file  ?Physical Activity: Not on file  ?Stress: Not on file  ?Social Connections: Not on file  ?Intimate Partner Violence: Not on file  ? ?PHYSICAL EXAM ? ?Vitals:  ? 04/09/21 1313  ?BP: (!) 156/83  ?Pulse: 82  ?Weight: 184 lb (83.5 kg)  ?Height: 5' 4.5" (1.638 m)  ? ? ? ?Body mass index is 31.1 kg/m?. ? ?Generalized: Well developed, in no acute distress  ?  ?Neurological examination  ?Mentation: Alert oriented to time, place, history taking. Follows all commands speech and language fluent ?Cranial nerve II-XII: Pupils were equal round reactive to light. Extraocular movements were full, visual field were full on confrontational test. Facial sensation and strength were normal. Head turning and shoulder shrug  were normal and symmetric. ?Motor: The motor testing reveals 5 over 5 strength of all 4 extremities. Good symmetric motor tone is noted throughout.  ?Sensory: Sensory  testing is intact to soft touch on all 4 extremities. No evidence of extinction is noted.  ?Coordination: Cerebellar testing reveals good finger-nose-finger and heel-to-shin bilaterally.  ?Gait and station: Ga

## 2021-04-09 NOTE — Patient Instructions (Signed)
Continue Trileptal  ?Change Lyrica to 150 mg 3 times daily  ?Add on Lamictal 25 mg twice daily x 1 week, then 50 mg twice daily, stay on that dose, we can increase if needed, just let me know ?Watch for rash, drink plenty of water ?Check labs today  ? ?Meds ordered this encounter  ?Medications  ? pregabalin (LYRICA) 150 MG capsule  ?  Sig: Take 1 capsule (150 mg total) by mouth in the morning, at noon, and at bedtime.  ?  Dispense:  90 capsule  ?  Refill:  1  ?  Please cancel any refills on file, this reflects the most up to date dosing  ? lamoTRIgine (LAMICTAL) 25 MG tablet  ?  Sig: Take 1 tablet twice daily for 1 week, then take 2 tablets twice daily  ?  Dispense:  120 tablet  ?  Refill:  3  ? Oxcarbazepine (TRILEPTAL) 300 MG tablet  ?  Sig: Take two tabs in am , one tab midday, two tabs in pm.  ?  Dispense:  150 tablet  ?  Refill:  3  ?  Please void other refills on file. This rx represents the most recent therapy change.  ? ? ?

## 2021-04-09 NOTE — Progress Notes (Signed)
Chart reviewed, agree above plan ?

## 2021-04-11 LAB — COMPREHENSIVE METABOLIC PANEL
ALT: 14 IU/L (ref 0–32)
AST: 15 IU/L (ref 0–40)
Albumin/Globulin Ratio: 2.2 (ref 1.2–2.2)
Albumin: 4.8 g/dL (ref 3.8–4.8)
Alkaline Phosphatase: 70 IU/L (ref 44–121)
BUN/Creatinine Ratio: 16 (ref 12–28)
BUN: 9 mg/dL (ref 8–27)
Bilirubin Total: 0.4 mg/dL (ref 0.0–1.2)
CO2: 23 mmol/L (ref 20–29)
Calcium: 10.1 mg/dL (ref 8.7–10.3)
Chloride: 98 mmol/L (ref 96–106)
Creatinine, Ser: 0.58 mg/dL (ref 0.57–1.00)
Globulin, Total: 2.2 g/dL (ref 1.5–4.5)
Glucose: 97 mg/dL (ref 70–99)
Potassium: 4.2 mmol/L (ref 3.5–5.2)
Sodium: 138 mmol/L (ref 134–144)
Total Protein: 7 g/dL (ref 6.0–8.5)
eGFR: 101 mL/min/{1.73_m2} (ref >59)

## 2021-04-11 LAB — 10-HYDROXYCARBAZEPINE: Oxcarbazepine SerPl-Mcnc: 28 ug/mL (ref 10–35)

## 2021-04-15 DIAGNOSIS — H9202 Otalgia, left ear: Secondary | ICD-10-CM | POA: Diagnosis not present

## 2021-04-15 DIAGNOSIS — H6982 Other specified disorders of Eustachian tube, left ear: Secondary | ICD-10-CM | POA: Diagnosis not present

## 2021-05-08 DIAGNOSIS — G500 Trigeminal neuralgia: Secondary | ICD-10-CM | POA: Diagnosis not present

## 2021-05-08 DIAGNOSIS — Z510 Encounter for antineoplastic radiation therapy: Secondary | ICD-10-CM | POA: Diagnosis not present

## 2021-05-11 ENCOUNTER — Encounter: Payer: Self-pay | Admitting: Cardiology

## 2021-05-11 DIAGNOSIS — E785 Hyperlipidemia, unspecified: Secondary | ICD-10-CM | POA: Insufficient documentation

## 2021-05-11 DIAGNOSIS — I10 Essential (primary) hypertension: Secondary | ICD-10-CM | POA: Insufficient documentation

## 2021-05-11 NOTE — Assessment & Plan Note (Signed)
Blood pressure was pretty well controlled on losartan and HCTZ. ?

## 2021-05-11 NOTE — Assessment & Plan Note (Signed)
Doing much better.  Much less frequent. ? ?Continues to use [employer] - ? ?Would like to avoid treatment beyond her PRN Lopressor.  If symptoms become more consistent, would probably place her on just a low-dose starting beta-blocker. ?? Discussed adequate hydration vagal maneuvers ?? Avoiding triggers ?

## 2021-05-11 NOTE — Assessment & Plan Note (Signed)
Management PCP on atorvastatin.  Most recent LDL was 98 which is pretty much at goal.  I do not see recent labs from anything besides 2020. ?

## 2021-05-26 ENCOUNTER — Ambulatory Visit: Payer: BC Managed Care – PPO | Admitting: Neurology

## 2021-05-26 ENCOUNTER — Encounter: Payer: Self-pay | Admitting: Neurology

## 2021-05-26 VITALS — BP 152/76 | HR 82 | Ht 64.0 in | Wt 176.0 lb

## 2021-05-26 DIAGNOSIS — G500 Trigeminal neuralgia: Secondary | ICD-10-CM | POA: Diagnosis not present

## 2021-05-26 NOTE — Progress Notes (Signed)
HISTORY OF PRESENT ILLNESS: Heather Jenkins is a 66 year old female, seen in request by her primary care physician Dr. Jacalyn Lefevre, Jesse Sans, for evaluation of intermittent left facial pain, she is accompanied by her husband at today's clinical visit on May 02, 2019.   I have reviewed and summarized the referring note from the referring physician.  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   She reported intermittent left facial pain, shooting from left cheek to left nose since September 2019, initial episode was short lasting, intermittent, she was treated as sinus infection   She had recurrent episode again in June 2020, lasting for 3 months, no diagnosis was made   She had most severe episode since February 2021, still ongoing, she reported the pain is present three-quarter of the time, sharp severe radiating pain from left cheek to left inner eye corner, worsening by teeth brushing, chewing, talking, touching her face,   CT sinus from [facility] on April 25, 2019, amount of mucosal thickening at the left greater than right inferior maxillary sinus, minimum LOCATION of the right ethmoid air cells,   Was treated with titrating dose of gabapentin which has helped her symptoms some, now taking 300 mg 2 tablets 3 times a day, complains of drowsiness, despite that, she has been taking tramadol 50 mg 2 tablets 3 times a day, the combination helped her pain better, but she complains of increased side effect   She denies rash broke out, denies visual loss, hearing loss,  UPDATE Sept 29 2021: Her left facial pain for a while was under reasonable control with gabapentin 300 mg 2 tablets 3 times a day, Trileptal 300 mg 1 and half tablet twice a day, in June 2021, she attempted to decrease the medications, that had flareup of her trigeminal pain, call the office August 26, 20 21, Trileptal was increased from 300 twice daily to 450  twice daily, which has helped her symptoms  She complains of some side effect from the medications, dizziness, lightheadedness, unsteadiness  Update April 03, 2020 SS: Here today alone, back in December, she hit left side of her head in the attic, had a flare, lasting 4 weeks. She increased slightly Trileptal 1.5 tablets, 2 in the evening. Gabapentin 600 mg twice daily, never takes a midday dose. Claims in Feb, took gabapentin first before the Trileptal (usually takes 2 hours apart), heart rate increased to 170, was racing, went to primary care. Wore heart monitor, doesn't have the results yet. Keeps metoprolol on hand. Has started eating piece of toast. Pain is level 1 or 2, very cautious, careful. PCP checked blood work.  Does feel her balance is not as good, but admits to inactivity, works from home, is rather sedentary.  In September 2021, Trileptal level was 20, CMP was unremarkable. Had labs done from PCP, sodium level 141, creatinine 0.7, liver function was normal.  Update December 11, 2020 SS: here today alone, TN pain is manageable, never gone. Is cautious, can cause pain if she rubs her left cheek in certain way. Overall well controlled, medications are working, afraid to reduce the dose, currently taking gabapentin 600 mg twice daily, Trileptal 300 mg 1.5 tablets in AM, 2 tablets PM. Many days she only takes gabapentin once daily. Has lost 13 lbs. 2 episodes of SVT seeing cardiology. Husband had MI back in summer, he is doing well.   I was able to review labs from  PCP visit 12/05/20, creatinine 0.7, sodium 140, AST 20, ALT 19, A1C 5.5.  Update April 09, 2021 SS: Here today to discuss consult with Dr. Salomon Fick and Dr. Vallarie Mare. Right now taking Trileptal 600 mg AM, 300 mg midday, 600 mg PM. Lyrica 100 mg 4 times daily. Gamma knife scheduled next month. Feels like in a fog, too much medication taking Lyrica 4 times daily. Has tramadol is needed. Pain is getting worse to the left, having to hold left  side of face, worse when talking. Doesn't think can tolerate any higher doses.  Labs 03/04/21 CBC and CMP normal, Trileptal level 33.  Update May 26, 2021 SS: Underwent left gamma knife 05/08/2021 with Dr. Vallarie Mare. On 05/23/21 felt a jab of pain with eating, none since then. Ate a hot dog without problem.  Wants to come off some medication. Dr. Vallarie Mare gave detailed plan to taper medication, starting June 4th once pain free for 1 week. Currently taking Lyrica 150 mg 3 times a day, Lamictal 50 mg twice a day, Trileptal 600/300/600.   REVIEW OF SYSTEMS: Out of a complete 14 system review of symptoms, the patient complains only of the following symptoms, and all other reviewed systems are negative.  See HPI  ALLERGIES: Allergies  Allergen Reactions   Penicillins Hives    HOME MEDICATIONS: Outpatient Medications Prior to Visit  Medication Sig Dispense Refill   atorvastatin (LIPITOR) 10 MG tablet Take 10 mg by mouth daily.     Cholecalciferol (VITAMIN D3 PO) Take 1,000 Units by mouth daily.     hydrochlorothiazide (HYDRODIURIL) 25 MG tablet Take 25 mg by mouth daily.     lamoTRIgine (LAMICTAL) 25 MG tablet Take 1 tablet twice daily for 1 week, then take 2 tablets twice daily 120 tablet 3   losartan (COZAAR) 100 MG tablet Take 100 mg by mouth daily.     metoprolol tartrate (LOPRESSOR) 50 MG tablet Take 1 tablet (50 mg total) by mouth as needed. 20 tablet 6   Oxcarbazepine (TRILEPTAL) 300 MG tablet Take two tabs in am , one tab midday, two tabs in pm. 150 tablet 3   pregabalin (LYRICA) 150 MG capsule Take 1 capsule (150 mg total) by mouth in the morning, at noon, and at bedtime. 90 capsule 1   traMADol (ULTRAM) 50 MG tablet Take 1 tablet (50 mg total) by mouth every 6 (six) hours as needed. 30 tablet 0   No facility-administered medications prior to visit.    PAST MEDICAL HISTORY: Past Medical History:  Diagnosis Date   Chronic maxillary sinusitis    History of kidney stones    Hyperlipemia     Hypertension    SVT (supraventricular tachycardia) (HCC)    Trigeminal neuralgia of left side of face     PAST SURGICAL HISTORY: Past Surgical History:  Procedure Laterality Date   PARS PLANA REPAIR OF RETINAL DEATACHMENT Right    Unsure of details   RECTOCELE REPAIR     TAH w/ single oophorectomy      FAMILY HISTORY: Family History  Problem Relation Age of Onset   Ovarian cancer Mother    Heart disease Mother        Not sure of details   Hypertension Mother    Suicidality Father        age 89   Melanoma Sister    Heart attack Brother 48       First MI in his 4s to 52s   Breast cancer Neg Hx  SOCIAL HISTORY: Social History   Socioeconomic History   Marital status: Married    Spouse name: Not on file   Number of children: 2   Years of education: college   Highest education level: Associate degree: academic program  Occupational History   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   2 children aged 68 and 53 (as of 2022)   Right-handed.   Caffeine: occasional use.   Exercises 7 days a week walking.   Social Determinants of Health   Financial Resource Strain: Not on file  Food Insecurity: Not on file  Transportation Needs: Not on file  Physical Activity: Not on file  Stress: Not on file  Social Connections: Not on file  Intimate Partner Violence: Not on file   PHYSICAL EXAM  Vitals:   05/26/21 1257  BP: (!) 152/76  Pulse: 82  Weight: 176 lb (79.8 kg)  Height: 5\' 4"  (1.626 m)   Body mass index is 30.21 kg/m.  Generalized: Well developed, in no acute distress    Neurological examination  Mentation: Alert oriented to time, place, history taking. Follows all  commands speech and language fluent Cranial nerve II-XII: Pupils were equal round reactive to light. Extraocular movements were full, visual field were full on confrontational test. Facial sensation and strength were normal. Head turning and shoulder shrug  were normal and symmetric. Motor: The motor testing reveals 5 over 5 strength of all 4 extremities. Good symmetric motor tone is noted throughout.  Sensory: Sensory testing is intact to soft touch on all 4 extremities. No evidence of extinction is noted.  Coordination: Cerebellar testing reveals good finger-nose-finger and heel-to-shin bilaterally.  Gait and station: Gait is normal.   Reflexes: Deep tendon reflexes are symmetric and normal bilaterally.  DIAGNOSTIC DATA (LABS, IMAGING, TESTING) - I reviewed patient records, labs, notes, testing and imaging myself where available.  Lab Results  Component Value Date   WBC 6.1 03/04/2021   HGB 14.2 03/04/2021   HCT 42.0 03/04/2021   MCV 89 03/04/2021   PLT 336 03/04/2021      Component Value Date/Time   NA 138 04/09/2021 1354   K 4.2 04/09/2021 1354   CL 98 04/09/2021 1354   CO2 23 04/09/2021 1354   GLUCOSE 97 04/09/2021 1354   BUN 9 04/09/2021 1354   CREATININE 0.58 04/09/2021 1354   CALCIUM 10.1 04/09/2021 1354   PROT 7.0 04/09/2021 1354   ALBUMIN 4.8 04/09/2021 1354   AST 15 04/09/2021 1354   ALT 14 04/09/2021 1354   ALKPHOS 70 04/09/2021 1354   BILITOT 0.4 04/09/2021 1354   GFRNONAA 97 10/04/2019 1315   GFRAA 112 10/04/2019 1315   No results found for: CHOL, HDL, LDLCALC, LDLDIRECT, TRIG, CHOLHDL No results found for: HGBA1C No results found for: VITAMINB12 Lab Results  Component Value Date   TSH 1.330 05/02/2019    ASSESSMENT AND PLAN 66 y.o. year old female  1. Left trigeminal neuralgia involving left V2 branch -Post gamma knife 05/08/21, doing well overall, has planned taper off medications starting June 4 off Lyrica, Lamictal, Trileptal -Follow-up with Dr. Vallarie Mare  in September, in December with  me  Evangeline Dakin, DNP  [facility] [address], [place], [place] [address] [phone]

## 2021-06-02 ENCOUNTER — Other Ambulatory Visit: Payer: Self-pay | Admitting: Neurology

## 2021-06-04 NOTE — Telephone Encounter (Signed)
Verify Drug Registry For Pregabalin 150 Mg Capsule Last Filled: 05/06/2021 Quantity: 90 capsules for 30 days Last appointment: 05/26/2021 Next appointment: 12/17/2021

## 2021-06-05 DIAGNOSIS — N200 Calculus of kidney: Secondary | ICD-10-CM | POA: Diagnosis not present

## 2021-06-25 DIAGNOSIS — E785 Hyperlipidemia, unspecified: Secondary | ICD-10-CM | POA: Diagnosis not present

## 2021-06-25 DIAGNOSIS — Z125 Encounter for screening for malignant neoplasm of prostate: Secondary | ICD-10-CM | POA: Diagnosis not present

## 2021-07-02 DIAGNOSIS — Z1339 Encounter for screening examination for other mental health and behavioral disorders: Secondary | ICD-10-CM | POA: Diagnosis not present

## 2021-07-02 DIAGNOSIS — Z1331 Encounter for screening for depression: Secondary | ICD-10-CM | POA: Diagnosis not present

## 2021-07-02 DIAGNOSIS — Z23 Encounter for immunization: Secondary | ICD-10-CM | POA: Diagnosis not present

## 2021-07-02 DIAGNOSIS — Z0000 Encounter for general adult medical examination without abnormal findings: Secondary | ICD-10-CM | POA: Diagnosis not present

## 2021-07-02 DIAGNOSIS — I10 Essential (primary) hypertension: Secondary | ICD-10-CM | POA: Diagnosis not present

## 2021-07-09 DIAGNOSIS — H2513 Age-related nuclear cataract, bilateral: Secondary | ICD-10-CM | POA: Diagnosis not present

## 2021-07-09 DIAGNOSIS — H31003 Unspecified chorioretinal scars, bilateral: Secondary | ICD-10-CM | POA: Diagnosis not present

## 2021-07-09 DIAGNOSIS — H43813 Vitreous degeneration, bilateral: Secondary | ICD-10-CM | POA: Diagnosis not present

## 2021-07-09 DIAGNOSIS — H5213 Myopia, bilateral: Secondary | ICD-10-CM | POA: Diagnosis not present

## 2021-07-26 ENCOUNTER — Other Ambulatory Visit: Payer: Self-pay | Admitting: Neurology

## 2021-07-30 ENCOUNTER — Encounter: Payer: Self-pay | Admitting: Neurology

## 2021-08-04 ENCOUNTER — Encounter: Payer: Self-pay | Admitting: Neurology

## 2021-08-05 ENCOUNTER — Other Ambulatory Visit: Payer: Self-pay | Admitting: Internal Medicine

## 2021-08-05 DIAGNOSIS — Z1231 Encounter for screening mammogram for malignant neoplasm of breast: Secondary | ICD-10-CM

## 2021-08-06 DIAGNOSIS — J0100 Acute maxillary sinusitis, unspecified: Secondary | ICD-10-CM | POA: Diagnosis not present

## 2021-08-22 DIAGNOSIS — Z130 Encounter for screening for diseases of the blood and blood-forming organs and certain disorders involving the immune mechanism: Secondary | ICD-10-CM | POA: Diagnosis not present

## 2021-08-22 DIAGNOSIS — Z01419 Encounter for gynecological examination (general) (routine) without abnormal findings: Secondary | ICD-10-CM | POA: Diagnosis not present

## 2021-08-22 DIAGNOSIS — Z842 Family history of other diseases of the genitourinary system: Secondary | ICD-10-CM | POA: Diagnosis not present

## 2021-08-22 DIAGNOSIS — Z1389 Encounter for screening for other disorder: Secondary | ICD-10-CM | POA: Diagnosis not present

## 2021-08-25 ENCOUNTER — Ambulatory Visit
Admission: RE | Admit: 2021-08-25 | Discharge: 2021-08-25 | Disposition: A | Discharge status: Home / Self Care | Payer: BC Managed Care – PPO | Source: Ambulatory Visit | Attending: Internal Medicine | Admitting: Internal Medicine

## 2021-08-25 DIAGNOSIS — Z1231 Encounter for screening mammogram for malignant neoplasm of breast: Secondary | ICD-10-CM

## 2021-08-27 ENCOUNTER — Other Ambulatory Visit: Payer: Self-pay | Admitting: Internal Medicine

## 2021-08-27 DIAGNOSIS — R928 Other abnormal and inconclusive findings on diagnostic imaging of breast: Secondary | ICD-10-CM

## 2021-09-03 ENCOUNTER — Ambulatory Visit
Admission: RE | Admit: 2021-09-03 | Discharge: 2021-09-03 | Disposition: A | Discharge status: Home / Self Care | Payer: BC Managed Care – PPO | Source: Ambulatory Visit | Attending: Internal Medicine | Admitting: Internal Medicine

## 2021-09-03 DIAGNOSIS — N6311 Unspecified lump in the right breast, upper outer quadrant: Secondary | ICD-10-CM | POA: Diagnosis not present

## 2021-09-03 DIAGNOSIS — N6001 Solitary cyst of right breast: Secondary | ICD-10-CM | POA: Diagnosis not present

## 2021-09-03 DIAGNOSIS — R928 Other abnormal and inconclusive findings on diagnostic imaging of breast: Secondary | ICD-10-CM

## 2021-09-04 ENCOUNTER — Other Ambulatory Visit: Payer: BC Managed Care – PPO

## 2021-09-22 DIAGNOSIS — J3089 Other allergic rhinitis: Secondary | ICD-10-CM | POA: Diagnosis not present

## 2021-10-02 DIAGNOSIS — G500 Trigeminal neuralgia: Secondary | ICD-10-CM | POA: Diagnosis not present

## 2021-10-10 DIAGNOSIS — Z8041 Family history of malignant neoplasm of ovary: Secondary | ICD-10-CM | POA: Diagnosis not present

## 2021-10-27 DIAGNOSIS — R0981 Nasal congestion: Secondary | ICD-10-CM | POA: Diagnosis not present

## 2021-10-27 DIAGNOSIS — I10 Essential (primary) hypertension: Secondary | ICD-10-CM | POA: Diagnosis not present

## 2021-12-01 ENCOUNTER — Encounter: Payer: Self-pay | Admitting: Cardiology

## 2021-12-01 ENCOUNTER — Ambulatory Visit: Payer: BC Managed Care – PPO | Attending: Cardiology | Admitting: Cardiology

## 2021-12-01 VITALS — BP 130/78 | HR 85 | Ht 64.0 in | Wt 185.0 lb

## 2021-12-01 DIAGNOSIS — I10 Essential (primary) hypertension: Secondary | ICD-10-CM | POA: Diagnosis not present

## 2021-12-01 DIAGNOSIS — I479 Paroxysmal tachycardia, unspecified: Secondary | ICD-10-CM | POA: Diagnosis not present

## 2021-12-01 DIAGNOSIS — R42 Dizziness and giddiness: Secondary | ICD-10-CM

## 2021-12-01 DIAGNOSIS — E7849 Other hyperlipidemia: Secondary | ICD-10-CM | POA: Diagnosis not present
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
## 2021-12-01 NOTE — Patient Instructions (Addendum)

## 2021-12-02 DIAGNOSIS — G500 Trigeminal neuralgia: Secondary | ICD-10-CM | POA: Diagnosis not present

## 2021-12-16 NOTE — Progress Notes (Unsigned)
HISTORY OF PRESENT ILLNESS: Heather Jenkins is a 66 year old female, seen in request by her primary care physician Dr. Jacalyn Lefevre, Heather Jenkins, for evaluation of intermittent left facial pain, she is accompanied by her husband at today's clinical visit on May 02, 2019.   I have reviewed and summarized the referring note from the referring physician.  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   She reported intermittent left facial pain, shooting from left cheek to left nose since September 2019, initial episode was short lasting, intermittent, she was treated as sinus infection   She had recurrent episode again in June 2020, lasting for 3 months, no diagnosis was made   She had most severe episode since February 2021, still ongoing, she reported the pain is present three-quarter of the time, sharp severe radiating pain from left cheek to left inner eye corner, worsening by teeth brushing, chewing, talking, touching her face,   CT sinus from [facility] on April 25, 2019, amount of mucosal thickening at the left greater than right inferior maxillary sinus, minimum LOCATION of the right ethmoid air cells,   Was treated with titrating dose of gabapentin which has helped her symptoms some, now taking 300 mg 2 tablets 3 times a day, complains of drowsiness, despite that, she has been taking tramadol 50 mg 2 tablets 3 times a day, the combination helped her pain better, but she complains of increased side effect   She denies rash broke out, denies visual loss, hearing loss,  UPDATE Sept 29 2021: Her left facial pain for a while was under reasonable control with gabapentin 300 mg 2 tablets 3 times a day, Trileptal 300 mg 1 and half tablet twice a day, in June 2021, she attempted to decrease the medications, that had flareup of her trigeminal pain, call the office August 26, 20 21, Trileptal was increased from 300 twice daily to 450  twice daily, which has helped her symptoms  She complains of some side effect from the medications, dizziness, lightheadedness, unsteadiness  Update April 03, 2020 SS: Here today alone, back in December, she hit left side of her head in the attic, had a flare, lasting 4 weeks. She increased slightly Trileptal 1.5 tablets, 2 in the evening. Gabapentin 600 mg twice daily, never takes a midday dose. Claims in Feb, took gabapentin first before the Trileptal (usually takes 2 hours apart), heart rate increased to 170, was racing, went to primary care. Wore heart monitor, doesn't have the results yet. Keeps metoprolol on hand. Has started eating piece of toast. Pain is level 1 or 2, very cautious, careful. PCP checked blood work.  Does feel her balance is not as good, but admits to inactivity, works from home, is rather sedentary.  In September 2021, Trileptal level was 20, CMP was unremarkable. Had labs done from PCP, sodium level 141, creatinine 0.7, liver function was normal.  Update December 11, 2020 SS: here today alone, TN pain is manageable, never gone. Is cautious, can cause pain if she rubs her left cheek in certain way. Overall well controlled, medications are working, afraid to reduce the dose, currently taking gabapentin 600 mg twice daily, Trileptal 300 mg 1.5 tablets in AM, 2 tablets PM. Many days she only takes gabapentin once daily. Has lost 13 lbs. 2 episodes of SVT seeing cardiology. Husband had MI back in summer, he is doing well.   I was able to review labs from  PCP visit 12/05/20, creatinine 0.7, sodium 140, AST 20, ALT 19, A1C 5.5.  Update April 09, 2021 SS: Here today to discuss consult with Dr. Angelyn Punt and Dr. Johny Drilling. Right now taking Trileptal 600 mg AM, 300 mg midday, 600 mg PM. Lyrica 100 mg 4 times daily. Gamma knife scheduled next month. Feels like in a fog, too much medication taking Lyrica 4 times daily. Has tramadol is needed. Pain is getting worse to the left, having to hold left  side of face, worse when talking. Doesn't think can tolerate any higher doses.  Labs 03/04/21 CBC and CMP normal, Trileptal level 33.  Update May 26, 2021 SS: Underwent left gamma knife 05/08/2021 with Dr. Johny Drilling. On 05/23/21 felt a jab of pain with eating, none since then. Ate a hot dog without problem.  Wants to come off some medication. Dr. Johny Drilling gave detailed plan to taper medication, starting June 4th once pain free for 1 week. Currently taking Lyrica 150 mg 3 times a day, Lamictal 50 mg twice a day, Trileptal 600/300/600.   Update December 17, 2021 SS: Back on Trileptal 300 mg twice daily. Still having pain to left TN. Has only gone 7 straight days since gamma knife without any pain. Today has had several jabs of pain.  Has been offered another gamma knife, possibly considering February 2024.  REVIEW OF SYSTEMS: Out of a complete 14 system review of symptoms, the patient complains only of the following symptoms, and all other reviewed systems are negative.  See HPI  ALLERGIES: Allergies  Allergen Reactions   Penicillins Hives    HOME MEDICATIONS: Outpatient Medications Prior to Visit  Medication Sig Dispense Refill   atorvastatin (LIPITOR) 10 MG tablet Take 10 mg by mouth daily.     Cholecalciferol (VITAMIN D3 PO) Take 1,000 Units by mouth daily.     hydrochlorothiazide (HYDRODIURIL) 25 MG tablet Take 25 mg by mouth daily.     losartan (COZAAR) 100 MG tablet Take 100 mg by mouth daily.     metoprolol tartrate (LOPRESSOR) 50 MG tablet Take 1 tablet (50 mg total) by mouth as needed. 20 tablet 6   traMADol (ULTRAM) 50 MG tablet Take 1 tablet (50 mg total) by mouth every 6 (six) hours as needed. 30 tablet 0   Oxcarbazepine (TRILEPTAL) 300 MG tablet Take 300 mg by mouth 2 (two) times daily.     No facility-administered medications prior to visit.    PAST MEDICAL HISTORY: Past Medical History:  Diagnosis Date   Chronic maxillary sinusitis    History of kidney stones    Hyperlipemia     Hypertension    SVT (supraventricular tachycardia)    Trigeminal neuralgia of left side of face     PAST SURGICAL HISTORY: Past Surgical History:  Procedure Laterality Date   PARS PLANA REPAIR OF RETINAL DEATACHMENT Right    Unsure of details   RECTOCELE REPAIR     TAH w/ single oophorectomy      FAMILY HISTORY: Family History  Problem Relation Age of Onset   Ovarian cancer Mother    Heart disease Mother        Not sure of details   Hypertension Mother    Suicidality Father        age 30   Melanoma Sister    Heart attack Brother 28       First MI in his 57s to 60s   Breast cancer Neg Hx     SOCIAL HISTORY: Social History  Socioeconomic History   Marital status: Married    Spouse name: Not on file   Number of children: 2   Years of education: college   Highest education level: Associate degree: academic program  Occupational History   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   2 children aged 78 and 86 (as of 2022)   Right-handed.   Caffeine: occasional use.   Exercises 7 days a week walking.   Social Determinants of Health   Financial Resource Strain: Not on file  Food Insecurity: Not on file  Transportation Needs: Not on file  Physical Activity: Not on file  Stress: Not on file  Social Connections: Not on file  Intimate Partner Violence: Not on file   PHYSICAL EXAM  Vitals:   12/17/21 1313  BP: (!) 146/81  Pulse: 81  Weight: 185 lb (83.9 kg)  Height: 5\' 4"  (1.626 m)   Body mass index is 31.76 kg/m.  Generalized: Well developed, in no acute distress    Neurological examination  Mentation: Alert oriented to time, place, history taking. Follows all commands  speech and language fluent Cranial nerve II-XII: Pupils were equal round reactive to light. Extraocular movements were full, visual field were full on confrontational test. Facial sensation and strength were normal. Head turning and shoulder shrug  were normal and symmetric. Motor: The motor testing reveals 5 over 5 strength of all 4 extremities. Good symmetric motor tone is noted throughout.  Sensory: Sensory testing is intact to soft touch on all 4 extremities. No evidence of extinction is noted.  Coordination: Cerebellar testing reveals good finger-nose-finger and heel-to-shin bilaterally.  Gait and station: Gait is normal.   Reflexes: Deep tendon reflexes are symmetric and normal bilaterally.  DIAGNOSTIC DATA (LABS, IMAGING, TESTING) - I reviewed patient records, labs, notes, testing and imaging myself where available.  Lab Results  Component Value Date   WBC 6.1 03/04/2021   HGB 14.2 03/04/2021   HCT 42.0 03/04/2021   MCV 89 03/04/2021   PLT 336 03/04/2021      Component Value Date/Time   NA 138 04/09/2021 1354   K 4.2 04/09/2021 1354   CL 98 04/09/2021 1354   CO2 23 04/09/2021 1354   GLUCOSE 97 04/09/2021 1354   BUN 9 04/09/2021 1354   CREATININE 0.58 04/09/2021 1354   CALCIUM 10.1 04/09/2021 1354   PROT 7.0 04/09/2021 1354   ALBUMIN 4.8 04/09/2021 1354   AST 15 04/09/2021 1354   ALT 14 04/09/2021 1354   ALKPHOS 70 04/09/2021 1354   BILITOT 0.4 04/09/2021 1354   GFRNONAA 97 10/04/2019 1315   GFRAA 112 10/04/2019 1315   No results found for: "CHOL", "HDL", "LDLCALC", "LDLDIRECT", "TRIG", "CHOLHDL" No results found for: "HGBA1C" No results found for: "VITAMINB12" Lab Results  Component Value Date   TSH 1.330 05/02/2019    ASSESSMENT AND PLAN 66 y.o. year old female  1. Left trigeminal neuralgia involving left V2 branch -Post gamma knife 05/08/21, continued pain -Increase Trileptal 300 mg 3 times daily -Can add in gabapentin 300 mg 3 times daily if  needed -Considering repeat gamma knife in February 2024 -Recheck BMP as  baseline given increasing dose of Trileptal  Otila Kluver, DNP  [facility] [address] [place], [place] [address] [phone]

## 2021-12-17 ENCOUNTER — Encounter: Payer: Self-pay | Admitting: Neurology

## 2021-12-17 ENCOUNTER — Ambulatory Visit (INDEPENDENT_AMBULATORY_CARE_PROVIDER_SITE_OTHER): Payer: BC Managed Care – PPO | Admitting: Neurology

## 2021-12-17 ENCOUNTER — Ambulatory Visit: Payer: BC Managed Care – PPO | Admitting: Neurology

## 2021-12-17 VITALS — BP 146/81 | HR 81 | Ht 64.0 in | Wt 185.0 lb

## 2021-12-17 DIAGNOSIS — G500 Trigeminal neuralgia: Secondary | ICD-10-CM

## 2021-12-17 MED ORDER — OXCARBAZEPINE 300 MG PO TABS: 300.0000 mg | ORAL_TABLET | Freq: Three times a day (TID) | ORAL | 5 refills | Status: DC

## 2021-12-18 ENCOUNTER — Encounter: Payer: Self-pay | Admitting: Neurology

## 2021-12-18 LAB — BASIC METABOLIC PANEL
BUN/Creatinine Ratio: 15 (ref 12–28)
BUN: 12 mg/dL (ref 8–27)
CO2: 26 mmol/L (ref 20–29)
Calcium: 10.2 mg/dL (ref 8.7–10.3)
Chloride: 103 mmol/L (ref 96–106)
Creatinine, Ser: 0.79 mg/dL (ref 0.57–1.00)
Glucose: 98 mg/dL (ref 70–99)
Potassium: 4.4 mmol/L (ref 3.5–5.2)
Sodium: 143 mmol/L (ref 134–144)
eGFR: 83 mL/min/{1.73_m2} (ref >59)

## 2021-12-21 ENCOUNTER — Encounter: Payer: Self-pay | Admitting: Cardiology

## 2021-12-21 NOTE — Assessment & Plan Note (Signed)
BP controlled on HCTZ and losartan.  Also has PRN beta-blocker but not requiring it.

## 2021-12-21 NOTE — Assessment & Plan Note (Signed)
Discussed appropriate nutrition and hydration.
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
## 2021-12-21 NOTE — Assessment & Plan Note (Signed)
LDL is 118.  Probably related to diet.  She is on 10 of Lipitor.  Consider increasing to higher dose.  She wants to try to adjust her diet and reassess.  She will follow-up with her PCP but consider changing statin dose.

## 2022-01-01 DIAGNOSIS — Z7189 Other specified counseling: Secondary | ICD-10-CM | POA: Diagnosis not present

## 2022-01-01 DIAGNOSIS — R7303 Prediabetes: Secondary | ICD-10-CM | POA: Diagnosis not present

## 2022-01-01 DIAGNOSIS — I10 Essential (primary) hypertension: Secondary | ICD-10-CM | POA: Diagnosis not present

## 2022-01-01 DIAGNOSIS — E785 Hyperlipidemia, unspecified: Secondary | ICD-10-CM | POA: Diagnosis not present

## 2022-07-15 NOTE — Progress Notes (Unsigned)
HISTORY OF PRESENT ILLNESS: Heather Jenkins is a 67 year old female, seen in request by her primary care physician Dr. Nadene Rubins, [name], for evaluation of intermittent left facial pain, she is accompanied by her husband at today's clinical visit on May 02, 2019.   I have reviewed and summarized the referring note from the referring physician.  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   She reported intermittent left facial pain, shooting from left cheek to left nose since September 2019, initial episode was short lasting, intermittent, she was treated as sinus infection   She had recurrent episode again in June 2020, lasting for 3 months, no diagnosis was made   She had most severe episode since February 2021, still ongoing, she reported the pain is present three-quarter of the time, sharp severe radiating pain from left cheek to left inner eye corner, worsening by teeth brushing, chewing, talking, touching her face,   CT sinus from [facility] on April 25, 2019, amount of mucosal thickening at the left greater than right inferior maxillary sinus, minimum LOCATION of the right ethmoid air cells,   Was treated with titrating dose of gabapentin which has helped her symptoms some, now taking 300 mg 2 tablets 3 times a day, complains of drowsiness, despite that, she has been taking tramadol 50 mg 2 tablets 3 times a day, the combination helped her pain better, but she complains of increased side effect   She denies rash broke out, denies visual loss, hearing loss,  UPDATE Sept 29 2021: Her left facial pain for a while was under reasonable control with gabapentin 300 mg 2 tablets 3 times a day, Trileptal 300 mg 1 and half tablet twice a day, in June 2021, she attempted to decrease the medications, that had flareup of her trigeminal pain, call the office August 26, 20 21, Trileptal was increased from 300 twice daily to 450  twice daily, which has helped her symptoms  She complains of some side effect from the medications, dizziness, lightheadedness, unsteadiness  Update April 03, 2020 SS: Here today alone, back in December, she hit left side of her head in the attic, had a flare, lasting 4 weeks. She increased slightly Trileptal 1.5 tablets, 2 in the evening. Gabapentin 600 mg twice daily, never takes a midday dose. Claims in Feb, took gabapentin first before the Trileptal (usually takes 2 hours apart), heart rate increased to 170, was racing, went to primary care. Wore heart monitor, doesn't have the results yet. Keeps metoprolol on hand. Has started eating piece of toast. Pain is level 1 or 2, very cautious, careful. PCP checked blood work.  Does feel her balance is not as good, but admits to inactivity, works from home, is rather sedentary.  In September 2021, Trileptal level was 20, CMP was unremarkable. Had labs done from PCP, sodium level 141, creatinine 0.7, liver function was normal.  Update December 11, 2020 SS: here today alone, TN pain is manageable, never gone. Is cautious, can cause pain if she rubs her left cheek in certain way. Overall well controlled, medications are working, afraid to reduce the dose, currently taking gabapentin 600 mg twice daily, Trileptal 300 mg 1.5 tablets in AM, 2 tablets PM. Many days she only takes gabapentin once daily. Has lost 13 lbs. 2 episodes of SVT seeing cardiology. Husband had MI back in summer, he is doing well.   I was able to review labs from  PCP visit 12/05/20, creatinine 0.7, sodium 140, AST 20, ALT 19, A1C 5.5.  Update April 09, 2021 SS: Here today to discuss consult with Dr. Angelyn Punt and Dr. Johny Drilling. Right now taking Trileptal 600 mg AM, 300 mg midday, 600 mg PM. Lyrica 100 mg 4 times daily. Gamma knife scheduled next month. Feels like in a fog, too much medication taking Lyrica 4 times daily. Has tramadol is needed. Pain is getting worse to the left, having to hold left  side of face, worse when talking. Doesn't think can tolerate any higher doses.  Labs 03/04/21 CBC and CMP normal, Trileptal level 33.  Update May 26, 2021 SS: Underwent left gamma knife 05/08/2021 with Dr. Johny Drilling. On 05/23/21 felt a jab of pain with eating, none since then. Ate a hot dog without problem.  Wants to come off some medication. Dr. Johny Drilling gave detailed plan to taper medication, starting June 4th once pain free for 1 week. Currently taking Lyrica 150 mg 3 times a day, Lamictal 50 mg twice a day, Trileptal 600/300/600.   Update December 17, 2021 SS: Back on Trileptal 300 mg twice daily. Still having pain to left TN. Has only gone 7 straight days since gamma knife without any pain. Today has had several jabs of pain.  Has been offered another gamma knife, possibly considering February 2024.  Update July 16, 2022 SS: Have been doing well since Feb, currently taking Trileptal 300 mg BID, no gabapentin. No pain since Feb, she is worried about decreasing further. Still has tingly, numbness to left side,  on her tongue, is not painful. Now can wash her face. Labs 07/07/22 NA 140, AST 18 ALT 22, TSH 1.35, creatinine 0.7. A1C 5.9.   REVIEW OF SYSTEMS: Out of a complete 14 system review of symptoms, the patient complains only of the following symptoms, and all other reviewed systems are negative.  See HPI  ALLERGIES: Allergies  Allergen Reactions   Penicillins Hives    HOME MEDICATIONS: Outpatient Medications Prior to Visit  Medication Sig Dispense Refill   atorvastatin (LIPITOR) 10 MG tablet Take 10 mg by mouth daily.     Cholecalciferol (VITAMIN D3 PO) Take 1,000 Units by mouth daily.     fexofenadine (ALLEGRA) 180 MG tablet Take 180 mg by mouth daily.     hydrochlorothiazide (HYDRODIURIL) 25 MG tablet Take 25 mg by mouth daily.     losartan (COZAAR) 100 MG tablet Take 100 mg by mouth daily.     Oxcarbazepine (TRILEPTAL) 300 MG tablet Take 1 tablet (300 mg total) by mouth 3 (three) times daily.  90 tablet 5   metoprolol tartrate (LOPRESSOR) 50 MG tablet Take 1 tablet (50 mg total) by mouth as needed. (Patient not taking: Reported on 07/16/2022) 20 tablet 6   traMADol (ULTRAM) 50 MG tablet Take 1 tablet (50 mg total) by mouth every 6 (six) hours as needed. (Patient not taking: Reported on 07/16/2022) 30 tablet 0   No facility-administered medications prior to visit.    PAST MEDICAL HISTORY: Past Medical History:  Diagnosis Date   Chronic maxillary sinusitis    History of kidney stones    Hyperlipemia    Hypertension    SVT (supraventricular tachycardia)    Trigeminal neuralgia of left side of face     PAST SURGICAL HISTORY: Past Surgical History:  Procedure Laterality Date   PARS PLANA REPAIR OF RETINAL DEATACHMENT Right    Unsure of details   RECTOCELE REPAIR     TAH w/ single oophorectomy  FAMILY HISTORY: Family History  Problem Relation Age of Onset   Ovarian cancer Mother    Heart disease Mother        Not sure of details   Hypertension Mother    Suicidality Father        age 3   Melanoma Sister    Heart attack Brother 70       First MI in his 26s to 64s   Breast cancer Neg Hx     SOCIAL HISTORY: Social History   Socioeconomic History   Marital status: Married    Spouse name: Not on file   Number of children: 2   Years of education: college   Highest education level: Associate degree: academic program  Occupational History   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   2 children aged 5 and 48 (as of 2022)   Right-handed.   Caffeine: occasional use.   Exercises 7 days a week walking.   Social Determinants of Health   Financial Resource Strain: Not on file   Food Insecurity: Not on file  Transportation Needs: Not on file  Physical Activity: Not on file  Stress: Not on file  Social Connections: Unknown (05/20/2021)   [facility]   Social Network    Social Network: Not on file  Intimate Partner Violence: Unknown (04/11/2021)   Received from [facility]   HITS    Physically Hurt: Not on file    Insult or Talk Down To: Not on file    Threaten Physical Harm: Not on file    Scream or Curse: Not on file   PHYSICAL EXAM  Vitals:   07/16/22 1354 07/16/22 1359  BP: (!) 146/75 (!) 140/71  Pulse: 87   Weight: 184 lb 15.5 oz (83.9 kg)   Height: 5' 4.5" (1.638 m)    Body mass index is 31.26 kg/m.  Generalized: Well developed, in no acute distress    Neurological examination  Mentation: Alert oriented to time, place, history taking. Follows all commands speech and language fluent Cranial nerve II-XII: Pupils were equal round reactive to light. Extraocular movements were full, visual field were full on confrontational test. Facial sensation and strength were normal. Head turning and shoulder shrug  were normal and symmetric. Motor: The motor testing reveals 5 over 5 strength of all 4 extremities. Good symmetric motor tone is noted throughout.  Sensory: Sensory testing is intact to soft touch on all 4 extremities. No evidence of extinction is noted.  Coordination: Cerebellar testing reveals good finger-nose-finger and heel-to-shin bilaterally.  Gait and station: Gait is normal.    DIAGNOSTIC DATA (LABS, IMAGING, TESTING) - I reviewed patient records, labs, notes, testing and imaging myself where available.  Lab Results  Component Value Date   WBC 6.1 03/04/2021   HGB 14.2 03/04/2021   HCT 42.0 03/04/2021   MCV 89 03/04/2021   PLT 336 03/04/2021      Component Value Date/Time   NA 143 12/17/2021 1349   K 4.4 12/17/2021 1349   CL 103 12/17/2021 1349   CO2 26 12/17/2021 1349   GLUCOSE 98 12/17/2021 1349  BUN 12  12/17/2021 1349   CREATININE 0.79 12/17/2021 1349   CALCIUM 10.2 12/17/2021 1349   PROT 7.0 04/09/2021 1354   ALBUMIN 4.8 04/09/2021 1354   AST 15 04/09/2021 1354   ALT 14 04/09/2021 1354   ALKPHOS 70 04/09/2021 1354   BILITOT 0.4 04/09/2021 1354   GFRNONAA 97 10/04/2019 1315   GFRAA 112 10/04/2019 1315   No results found for: "CHOL", "HDL", "LDLCALC", "LDLDIRECT", "TRIG", "CHOLHDL" No results found for: "HGBA1C" No results found for: "VITAMINB12" Lab Results  Component Value Date   TSH 1.330 05/02/2019   ASSESSMENT AND PLAN 67 y.o. year old female  1. Left trigeminal neuralgia involving left V2 branch -Post gamma knife 05/08/21, much improved -Has slowly weaned the dose of Trileptal down to 300 mg twice daily, discussed further decreasing 150/300 mg, wean slowly as tolerated, she is fearful pain will return  -Reach out via MyChart if needed, follow-up 1 year or sooner if needed, she will let me know when refill Trileptal as needed, currently has abundance  Margie Ege, Edrick Oh, DNP  [facility] [address], [address] [place], [place] [address] [phone]

## 2022-07-16 ENCOUNTER — Encounter: Payer: Self-pay | Admitting: Neurology

## 2022-07-16 ENCOUNTER — Ambulatory Visit: Payer: 59 | Admitting: Neurology

## 2022-07-16 VITALS — BP 140/71 | HR 87 | Ht 64.5 in | Wt 185.0 lb

## 2022-07-16 DIAGNOSIS — G500 Trigeminal neuralgia: Secondary | ICD-10-CM | POA: Diagnosis not present

## 2022-07-16 NOTE — Patient Instructions (Signed)
Always so great to see you!!  I am glad you are doing so well!  Try to further decrease Trileptal down to 150 mg AM/300 mg PM, slowly wean as tolerated.  Let me know if you need anything.  See you back in 1 year. Thanks!!

## 2022-08-14 ENCOUNTER — Other Ambulatory Visit: Payer: Self-pay | Admitting: Obstetrics and Gynecology

## 2022-08-14 DIAGNOSIS — Z1231 Encounter for screening mammogram for malignant neoplasm of breast: Secondary | ICD-10-CM

## 2022-08-26 ENCOUNTER — Other Ambulatory Visit: Payer: Self-pay | Admitting: Obstetrics and Gynecology

## 2022-08-26 DIAGNOSIS — E2839 Other primary ovarian failure: Secondary | ICD-10-CM

## 2022-08-27 ENCOUNTER — Ambulatory Visit
Admission: RE | Admit: 2022-08-27 | Discharge: 2022-08-27 | Disposition: A | Discharge status: Home / Self Care | Payer: No Typology Code available for payment source | Source: Ambulatory Visit | Attending: Obstetrics and Gynecology | Admitting: Obstetrics and Gynecology

## 2022-08-27 DIAGNOSIS — Z1231 Encounter for screening mammogram for malignant neoplasm of breast: Secondary | ICD-10-CM

## 2022-09-17 ENCOUNTER — Other Ambulatory Visit: Payer: Self-pay | Admitting: Neurology

## 2022-11-24 DIAGNOSIS — H43813 Vitreous degeneration, bilateral: Secondary | ICD-10-CM | POA: Diagnosis not present

## 2022-12-02 ENCOUNTER — Ambulatory Visit: Payer: BC Managed Care – PPO | Attending: Cardiology | Admitting: Cardiology

## 2022-12-02 ENCOUNTER — Encounter: Payer: Self-pay | Admitting: Cardiology

## 2022-12-02 VITALS — BP 128/70 | HR 76 | Ht 64.5 in | Wt 184.0 lb

## 2022-12-02 DIAGNOSIS — E7849 Other hyperlipidemia: Secondary | ICD-10-CM | POA: Diagnosis not present

## 2022-12-02 DIAGNOSIS — I10 Essential (primary) hypertension: Secondary | ICD-10-CM

## 2022-12-02 DIAGNOSIS — I479 Paroxysmal tachycardia, unspecified: Secondary | ICD-10-CM | POA: Diagnosis not present

## 2022-12-02 MED ORDER — METOPROLOL TARTRATE 50 MG PO TABS: 50.0000 mg | ORAL_TABLET | ORAL | 6 refills | Status: DC | PRN

## 2022-12-02 NOTE — Patient Instructions (Addendum)
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
## 2022-12-07 NOTE — Assessment & Plan Note (Signed)
Well controlled on current regimen of HCTZ, , and Losartan. -Discussed potential consolidation of HCTZ and Losartan into a combination medication with primary care provider.

## 2022-12-07 NOTE — Assessment & Plan Note (Signed)
No recent episodes. Previously managed with Metoprolol as needed but has not required it for some time. -Refill Metoprolol prescription for use as needed.

## 2022-12-07 NOTE — Assessment & Plan Note (Signed)
Last cholesterol check in June 2024, results not available. No changes to current medication regimen. -Continue current lipid-lowering therapy.

## 2022-12-16 DIAGNOSIS — H43813 Vitreous degeneration, bilateral: Secondary | ICD-10-CM | POA: Diagnosis not present

## 2022-12-16 DIAGNOSIS — H2513 Age-related nuclear cataract, bilateral: Secondary | ICD-10-CM | POA: Diagnosis not present

## 2023-01-21 DIAGNOSIS — E785 Hyperlipidemia, unspecified: Secondary | ICD-10-CM | POA: Diagnosis not present

## 2023-01-21 DIAGNOSIS — R7303 Prediabetes: Secondary | ICD-10-CM | POA: Diagnosis not present

## 2023-01-21 DIAGNOSIS — I10 Essential (primary) hypertension: Secondary | ICD-10-CM | POA: Diagnosis not present

## 2023-01-21 DIAGNOSIS — E669 Obesity, unspecified: Secondary | ICD-10-CM | POA: Diagnosis not present

## 2023-02-26 ENCOUNTER — Ambulatory Visit
Admission: RE | Admit: 2023-02-26 | Discharge: 2023-02-26 | Disposition: A | Discharge status: Home / Self Care | Payer: BC Managed Care – PPO | Source: Ambulatory Visit | Attending: Obstetrics and Gynecology | Admitting: Obstetrics and Gynecology

## 2023-02-26 DIAGNOSIS — E2839 Other primary ovarian failure: Secondary | ICD-10-CM | POA: Diagnosis not present

## 2023-02-26 DIAGNOSIS — M8588 Other specified disorders of bone density and structure, other site: Secondary | ICD-10-CM | POA: Diagnosis not present

## 2023-02-26 DIAGNOSIS — N958 Other specified menopausal and perimenopausal disorders: Secondary | ICD-10-CM | POA: Diagnosis not present

## 2023-03-13 ENCOUNTER — Other Ambulatory Visit: Payer: Self-pay | Admitting: Cardiology

## 2023-04-12 DIAGNOSIS — J3089 Other allergic rhinitis: Secondary | ICD-10-CM | POA: Diagnosis not present

## 2023-05-13 ENCOUNTER — Other Ambulatory Visit: Payer: Self-pay | Admitting: Internal Medicine

## 2023-05-13 DIAGNOSIS — Z13820 Encounter for screening for osteoporosis: Secondary | ICD-10-CM

## 2023-07-14 DIAGNOSIS — H25013 Cortical age-related cataract, bilateral: Secondary | ICD-10-CM | POA: Diagnosis not present

## 2023-07-14 DIAGNOSIS — H5213 Myopia, bilateral: Secondary | ICD-10-CM | POA: Diagnosis not present

## 2023-07-14 DIAGNOSIS — H524 Presbyopia: Secondary | ICD-10-CM | POA: Diagnosis not present

## 2023-07-14 DIAGNOSIS — H43813 Vitreous degeneration, bilateral: Secondary | ICD-10-CM | POA: Diagnosis not present

## 2023-07-14 DIAGNOSIS — H52203 Unspecified astigmatism, bilateral: Secondary | ICD-10-CM | POA: Diagnosis not present

## 2023-07-14 DIAGNOSIS — H31003 Unspecified chorioretinal scars, bilateral: Secondary | ICD-10-CM | POA: Diagnosis not present

## 2023-07-14 DIAGNOSIS — H2513 Age-related nuclear cataract, bilateral: Secondary | ICD-10-CM | POA: Diagnosis not present

## 2023-07-19 DIAGNOSIS — E785 Hyperlipidemia, unspecified: Secondary | ICD-10-CM | POA: Diagnosis not present

## 2023-07-19 DIAGNOSIS — R7303 Prediabetes: Secondary | ICD-10-CM | POA: Diagnosis not present

## 2023-07-22 ENCOUNTER — Encounter: Payer: Self-pay | Admitting: Neurology

## 2023-07-22 ENCOUNTER — Ambulatory Visit: Payer: 59 | Admitting: Neurology

## 2023-07-22 VITALS — BP 124/73 | HR 89 | Ht 64.5 in | Wt 185.0 lb

## 2023-07-22 DIAGNOSIS — G500 Trigeminal neuralgia: Secondary | ICD-10-CM

## 2023-07-22 NOTE — Progress Notes (Signed)
 HISTORY OF PRESENT ILLNESS: Heather Jenkins is a 68 year old female, seen in request by her primary care physician Dr. Stephane, Leita DEL, for evaluation of intermittent left facial pain, she is accompanied by her husband at today's clinical visit on May 02, 2019.   I have reviewed and summarized the referring note from the referring physician.  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   She reported intermittent left facial pain, shooting from left cheek to left nose since September 2019, initial episode was short lasting, intermittent, she was treated as sinus infection   She had recurrent episode again in June 2020, lasting for 3 months, no diagnosis was made   She had most severe episode since February 2021, still ongoing, she reported the pain is present three-quarter of the time, sharp severe radiating pain from left cheek to left inner eye corner, worsening by teeth brushing, chewing, talking, touching her face,   CT sinus from [facility] on April 25, 2019, amount of mucosal thickening at the left greater than right inferior maxillary sinus, minimum LOCATION of the right ethmoid air cells,   Was treated with titrating dose of gabapentin  which has helped her symptoms some, now taking 300 mg 2 tablets 3 times a day, complains of drowsiness, despite that, she has been taking tramadol  50 mg 2 tablets 3 times a day, the combination helped her pain better, but she complains of increased side effect   She denies rash broke out, denies visual loss, hearing loss,  UPDATE Sept 29 2021: Her left facial pain for a while was under reasonable control with gabapentin  300 mg 2 tablets 3 times a day, Trileptal  300 mg 1 and half tablet twice a day, in June 2021, she attempted to decrease the medications, that had flareup of her trigeminal pain, call the office August 26, 20 21, Trileptal  was increased from 300 twice daily to 450  twice daily, which has helped her symptoms  She complains of some side effect from the medications, dizziness, lightheadedness, unsteadiness  Update April 03, 2020 SS: Here today alone, back in December, she hit left side of her head in the attic, had a flare, lasting 4 weeks. She increased slightly Trileptal  1.5 tablets, 2 in the evening. Gabapentin  600 mg twice daily, never takes a midday dose. Claims in Feb, took gabapentin  first before the Trileptal  (usually takes 2 hours apart), heart rate increased to 170, was racing, went to primary care. Wore heart monitor, doesn't have the results yet. Keeps metoprolol  on hand. Has started eating piece of toast. Pain is level 1 or 2, very cautious, careful. PCP checked blood work.  Does feel her balance is not as good, but admits to inactivity, works from home, is rather sedentary.  In September 2021, Trileptal  level was 20, CMP was unremarkable. Had labs done from PCP, sodium level 141, creatinine 0.7, liver function was normal.  Update December 11, 2020 SS: here today alone, TN pain is manageable, never gone. Is cautious, can cause pain if she rubs her left cheek in certain way. Overall well controlled, medications are working, afraid to reduce the dose, currently taking gabapentin  600 mg twice daily, Trileptal  300 mg 1.5 tablets in AM, 2 tablets PM. Many days she only takes gabapentin  once daily. Has lost 13 lbs. 2 episodes of SVT seeing cardiology. Husband had MI back in summer, he is doing well.   I was able to review labs from  PCP visit 12/05/20, creatinine 0.7, sodium 140, AST 20, ALT 19, A1C 5.5.  Update April 09, 2021 SS: Here today to discuss consult with Dr. Nancye and Dr. Candyce. Right now taking Trileptal  600 mg AM, 300 mg midday, 600 mg PM. Lyrica  100 mg 4 times daily. Gamma knife scheduled next month. Feels like in a fog, too much medication taking Lyrica  4 times daily. Has tramadol  is needed. Pain is getting worse to the left, having to hold left  side of face, worse when talking. Doesn't think can tolerate any higher doses.  Labs 03/04/21 CBC and CMP normal, Trileptal  level 33.  Update May 26, 2021 SS: Underwent left gamma knife 05/08/2021 with Dr. Candyce. On 05/23/21 felt a jab of pain with eating, none since then. Ate a hot dog without problem.  Wants to come off some medication. Dr. Candyce gave detailed plan to taper medication, starting June 4th once pain free for 1 week. Currently taking Lyrica  150 mg 3 times a day, Lamictal  50 mg twice a day, Trileptal  600/300/600.   Update December 17, 2021 SS: Back on Trileptal  300 mg twice daily. Still having pain to left TN. Has only gone 7 straight days since gamma knife without any pain. Today has had several jabs of pain.  Has been offered another gamma knife, possibly considering February 2024.  Update July 16, 2022 SS: Have been doing well since Feb, currently taking Trileptal  300 mg BID, no gabapentin . No pain since Feb, she is worried about decreasing further. Still has tingly, numbness to left side,  on her tongue, is not painful. Now can wash her face. Labs 07/07/22 NA 140, AST 18 ALT 22, TSH 1.35, creatinine 0.7. A1C 5.9.   Update July 22, 2023 SS: Doing great, over the last year has weaned Trileptal  down to 150 mg once daily, no pain, still numbness to left tongue, left lip, left cheek, like a numb tingly feeling. Is very satisfied.   REVIEW OF SYSTEMS: Out of a complete 14 system review of symptoms, the patient complains only of the following symptoms, and all other reviewed systems are negative.  See HPI  ALLERGIES: Allergies  Allergen Reactions   Penicillins Hives    HOME MEDICATIONS: Outpatient Medications Prior to Visit  Medication Sig Dispense Refill   atorvastatin (LIPITOR) 10 MG tablet Take 10 mg by mouth daily.     Cholecalciferol (VITAMIN D3 PO) Take 1,000 Units by mouth daily.     fexofenadine (ALLEGRA) 180 MG tablet Take 180 mg by mouth daily.     hydrochlorothiazide  (HYDRODIURIL) 25 MG tablet Take 25 mg by mouth daily.     losartan (COZAAR) 100 MG tablet Take 100 mg by mouth daily.     metoprolol  tartrate (LOPRESSOR ) 50 MG tablet TAKE 1 TABLET (50 MG TOTAL) BY MOUTH AS NEEDED. 90 tablet 2   Oxcarbazepine  (TRILEPTAL ) 300 MG tablet TAKE 1 TABLET BY MOUTH 3 TIMES DAILY. 270 tablet 1   traMADol  (ULTRAM ) 50 MG tablet Take 1 tablet (50 mg total) by mouth every 6 (six) hours as needed. 30 tablet 0   No facility-administered medications prior to visit.    PAST MEDICAL HISTORY: Past Medical History:  Diagnosis Date   Chronic maxillary sinusitis    History of kidney stones    Hyperlipemia    Hypertension    SVT (supraventricular tachycardia) (HCC)    Trigeminal neuralgia of left side of face     PAST SURGICAL HISTORY: Past Surgical History:  Procedure Laterality Date  PARS PLANA REPAIR OF RETINAL DEATACHMENT Right    Unsure of details   RECTOCELE REPAIR     TAH w/ single oophorectomy      FAMILY HISTORY: Family History  Problem Relation Age of Onset   Ovarian cancer Mother    Heart disease Mother        Not sure of details   Hypertension Mother    Suicidality Father        age 71   Melanoma Sister    Heart attack Brother 36       First MI in his 43s to 51s   Breast cancer Neg Hx     SOCIAL HISTORY: Social History   Socioeconomic History   Marital status: Married    Spouse name: Not on file   Number of children: 2   Years of education: college   Highest education level: Associate degree: academic program  Occupational History   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   2 children aged 47 and 42 (as of 2022)   Right-handed.   Caffeine:  occasional use.   Exercises 7 days a week walking.   Social Drivers of Corporate investment banker Strain: Not on file  Food Insecurity: Not on file  Transportation Needs: Not on file  Physical Activity: Not on file  Stress: Not on file  Social Connections: Unknown (05/20/2021)   [facility] from [facility]   Social Network    Social Network: Not on file  Intimate Partner Violence: Unknown (04/11/2021)   Received from [facility]   HITS    Physically Hurt: Not on file    Insult or Talk Down To: Not on file    Threaten Physical Harm: Not on file    Scream or Curse: Not on file   PHYSICAL EXAM  Vitals:   07/22/23 1315  BP: 124/73  Pulse: 89  Weight: 185 lb (83.9 kg)  Height: 5' 4.5 (1.638 m)   Body mass index is 31.26 kg/m.  Generalized: Well developed, in no acute distress    Neurological examination  Mentation: Alert oriented to time, place, history taking. Follows all commands speech and language fluent Cranial nerve II-XII: Pupils were equal round reactive to light. Extraocular movements were full, visual field were full on confrontational test. Facial sensation and strength were normal. Head turning and shoulder shrug  were normal and symmetric. Motor: The motor testing reveals 5 over 5 strength of all 4 extremities. Good symmetric motor tone is noted throughout.  Sensory: Sensory testing is intact to soft touch on all 4 extremities. No evidence of extinction is noted.  Coordination: Cerebellar testing reveals good finger-nose-finger and heel-to-shin bilaterally.  Gait and station: Gait is normal.    DIAGNOSTIC DATA (LABS, IMAGING, TESTING) - I reviewed patient records, labs, notes, testing and imaging myself where available.  Lab Results  Component Value Date   WBC 6.1 03/04/2021   HGB 14.2 03/04/2021   HCT 42.0 03/04/2021   MCV 89 03/04/2021   PLT 336 03/04/2021      Component Value Date/Time   NA 143 12/17/2021 1349   K 4.4 12/17/2021 1349  CL 103  12/17/2021 1349   CO2 26 12/17/2021 1349   GLUCOSE 98 12/17/2021 1349   BUN 12 12/17/2021 1349   CREATININE 0.79 12/17/2021 1349   CALCIUM 10.2 12/17/2021 1349   PROT 7.0 04/09/2021 1354   ALBUMIN 4.8 04/09/2021 1354   AST 15 04/09/2021 1354   ALT 14 04/09/2021 1354   ALKPHOS 70 04/09/2021 1354   BILITOT 0.4 04/09/2021 1354   GFRNONAA 97 10/04/2019 1315   GFRAA 112 10/04/2019 1315   No results found for: CHOL, HDL, LDLCALC, LDLDIRECT, TRIG, CHOLHDL No results found for: YHAJ8R No results found for: VITAMINB12 Lab Results  Component Value Date   TSH 1.330 05/02/2019   ASSESSMENT AND PLAN 68 y.o. year old female  1. Left trigeminal neuralgia involving left V2 branch -Post gamma knife 05/08/21, very good result -Has weaned down dose of Trileptal  to 150 mg daily with continued relief of pain, has mild numb sensation to area -She will continue to try to discontinue the medication completely  -Reach out via MyChart if needed, follow-up 1 year or sooner if needed. She wishes to maintain annual follow up at least 1 more year  Lauraine Born, SCHARLENE, [facility]  [facility] [address], [address] [place], [place] [address] [phone]

## 2023-07-22 NOTE — Patient Instructions (Signed)
 Great to see you today! I am glad you are doing so well! Continue to try and stop the Trileptal . Call me if anything changes. Thanks!1

## 2023-07-23 DIAGNOSIS — Z1331 Encounter for screening for depression: Secondary | ICD-10-CM | POA: Diagnosis not present

## 2023-07-23 DIAGNOSIS — Z0000 Encounter for general adult medical examination without abnormal findings: Secondary | ICD-10-CM | POA: Diagnosis not present

## 2023-07-23 DIAGNOSIS — Z1339 Encounter for screening examination for other mental health and behavioral disorders: Secondary | ICD-10-CM | POA: Diagnosis not present

## 2023-07-23 DIAGNOSIS — I10 Essential (primary) hypertension: Secondary | ICD-10-CM | POA: Diagnosis not present

## 2023-08-09 ENCOUNTER — Other Ambulatory Visit: Payer: Self-pay | Admitting: Obstetrics and Gynecology

## 2023-08-09 DIAGNOSIS — Z1231 Encounter for screening mammogram for malignant neoplasm of breast: Secondary | ICD-10-CM

## 2023-08-18 DIAGNOSIS — N200 Calculus of kidney: Secondary | ICD-10-CM | POA: Diagnosis not present

## 2023-08-30 ENCOUNTER — Ambulatory Visit
Admission: RE | Admit: 2023-08-30 | Discharge: 2023-08-30 | Disposition: A | Discharge status: Home / Self Care | Source: Ambulatory Visit | Attending: Obstetrics and Gynecology | Admitting: Obstetrics and Gynecology

## 2023-08-30 DIAGNOSIS — Z1231 Encounter for screening mammogram for malignant neoplasm of breast: Secondary | ICD-10-CM | POA: Diagnosis not present

## 2023-09-01 DIAGNOSIS — Z01419 Encounter for gynecological examination (general) (routine) without abnormal findings: Secondary | ICD-10-CM | POA: Diagnosis not present

## 2023-12-08 ENCOUNTER — Encounter: Payer: Self-pay | Admitting: Neurology

## 2024-01-10 ENCOUNTER — Ambulatory Visit: Admitting: Cardiology

## 2024-01-26 ENCOUNTER — Telehealth: Payer: Self-pay | Admitting: Neurology

## 2024-01-26 ENCOUNTER — Ambulatory Visit: Admitting: Cardiology

## 2024-01-26 NOTE — Telephone Encounter (Signed)
 MYC cxl

## 2024-01-27 NOTE — Telephone Encounter (Signed)
 Pt has r/s her appointment

## 2024-01-31 ENCOUNTER — Ambulatory Visit: Admitting: Cardiology

## 2024-04-14 ENCOUNTER — Ambulatory Visit: Admitting: Cardiology

## 2024-07-27 ENCOUNTER — Ambulatory Visit: Admitting: Neurology

## 2024-08-16 ENCOUNTER — Ambulatory Visit: Admitting: Neurology
